# Patient Record
Sex: Female | Born: 1965 | Race: Black or African American | Hispanic: No | Marital: Married | Smoking: Light tobacco smoker
Health system: Southern US, Community
[De-identification: ages and names within clinical notes are randomized; demographics above are authoritative.]

## PROBLEM LIST (undated history)

## (undated) DIAGNOSIS — G709 Myoneural disorder, unspecified: Secondary | ICD-10-CM

## (undated) DIAGNOSIS — R0600 Dyspnea, unspecified: Secondary | ICD-10-CM

## (undated) DIAGNOSIS — F419 Anxiety disorder, unspecified: Secondary | ICD-10-CM

## (undated) DIAGNOSIS — R519 Headache, unspecified: Secondary | ICD-10-CM

## (undated) DIAGNOSIS — Z5189 Encounter for other specified aftercare: Secondary | ICD-10-CM

## (undated) DIAGNOSIS — G4730 Sleep apnea, unspecified: Secondary | ICD-10-CM

## (undated) DIAGNOSIS — N289 Disorder of kidney and ureter, unspecified: Secondary | ICD-10-CM

## (undated) HISTORY — PX: KIDNEY TRANSPLANT: SHX239

## (undated) HISTORY — DX: Encounter for other specified aftercare: Z51.89

## (undated) HISTORY — PX: NEPHRECTOMY TRANSPLANTED ORGAN: SUR880

## (undated) HISTORY — DX: Anxiety disorder, unspecified: F41.9

## (undated) HISTORY — PX: DILATION AND CURETTAGE OF UTERUS: SHX78

## (undated) HISTORY — DX: Headache, unspecified: R51.9

## (undated) HISTORY — DX: Myoneural disorder, unspecified: G70.9

## (undated) HISTORY — DX: Dyspnea, unspecified: R06.00

## (undated) HISTORY — DX: Sleep apnea, unspecified: G47.30

---

## 2011-03-23 DIAGNOSIS — Z940 Kidney transplant status: Secondary | ICD-10-CM

## 2011-03-23 HISTORY — DX: Kidney transplant status: Z94.0

## 2011-03-23 HISTORY — PX: KIDNEY TRANSPLANT: SHX239

## 2018-11-05 ENCOUNTER — Encounter (HOSPITAL_COMMUNITY): Payer: Self-pay

## 2018-11-05 ENCOUNTER — Other Ambulatory Visit: Payer: Self-pay

## 2018-11-05 ENCOUNTER — Ambulatory Visit (HOSPITAL_COMMUNITY)
Admission: EM | Admit: 2018-11-05 | Discharge: 2018-11-05 | Disposition: A | Discharge status: Home / Self Care | Payer: Managed Care, Other (non HMO) | Attending: Family Medicine | Admitting: Family Medicine

## 2018-11-05 DIAGNOSIS — R1011 Right upper quadrant pain: Secondary | ICD-10-CM | POA: Diagnosis not present

## 2018-11-05 HISTORY — DX: Disorder of kidney and ureter, unspecified: N28.9

## 2018-11-05 LAB — COMPREHENSIVE METABOLIC PANEL
ALT: 19 U/L (ref 0–44)
AST: 15 U/L (ref 15–41)
Albumin: 3.7 g/dL (ref 3.5–5.0)
Alkaline Phosphatase: 55 U/L (ref 38–126)
Anion gap: 12 (ref 5–15)
BUN: 5 mg/dL — ABNORMAL LOW (ref 6–20)
CO2: 20 mmol/L — ABNORMAL LOW (ref 22–32)
Calcium: 9.6 mg/dL (ref 8.9–10.3)
Chloride: 105 mmol/L (ref 98–111)
Creatinine, Ser: 0.62 mg/dL (ref 0.44–1.00)
GFR calc Af Amer: >60 mL/min (ref >60)
GFR calc non Af Amer: >60 mL/min (ref >60)
Glucose, Bld: 112 mg/dL — ABNORMAL HIGH (ref 70–99)
Potassium: 3.7 mmol/L (ref 3.5–5.1)
Sodium: 137 mmol/L (ref 135–145)
Total Bilirubin: 0.6 mg/dL (ref 0.3–1.2)
Total Protein: 7.1 g/dL (ref 6.5–8.1)

## 2018-11-05 LAB — CBC
HCT: 39.5 % (ref 36.0–46.0)
Hemoglobin: 13.3 g/dL (ref 12.0–15.0)
MCH: 33.6 pg (ref 26.0–34.0)
MCHC: 33.7 g/dL (ref 30.0–36.0)
MCV: 99.7 fL (ref 80.0–100.0)
Platelets: 275 10*3/uL (ref 150–400)
RBC: 3.96 MIL/uL (ref 3.87–5.11)
RDW: 12.1 % (ref 11.5–15.5)
WBC: 10 10*3/uL (ref 4.0–10.5)
nRBC: 0 % (ref 0.0–0.2)

## 2018-11-05 LAB — AMYLASE: Amylase: 60 U/L (ref 28–100)

## 2018-11-05 LAB — LIPASE, BLOOD: Lipase: 30 U/L (ref 11–51)

## 2018-11-05 NOTE — Discharge Instructions (Addendum)
Recommend using heating pad/hot towels for pain relief: Would avoid Tylenol, ibuprofen until your lab results are back. Recommend increasing water intake and eating foods that are soft to liquid. Liver, kidney, pancreatic function test pending: I will call you later today with the results and let you know the next if should be. Go to ER for further evaluation should you have worsening pain, shortness of breath, fever.

## 2018-11-05 NOTE — ED Provider Notes (Signed)
[facility]    CSN: [id number] Arrival date & time: 11/05/18  1250     History   Chief Complaint Chief Complaint  Patient presents with  . Side Pain    HPI Melanie [name] is a 53 y.o. female with history of cholecystectomy and renal transplant in April 2013 (placed in RLQ) on immunosuppressive therapy presenting for 2-day course of right upper quadrant pain.  Describes pain as near constant, worse with certain movements, and states it is "like a tightening sensation ".  Patient has tried Tylenol and Gas-X which has provided some relief in pain.  Patient has history of chronic nausea/vomiting due to immunosuppressive medications: Denies change from her baseline, biliary or bloody emesis.  Patient denies urinary symptoms, change in her bowel habit (1 bowel movement daily without blood or melena).  She denies history of pancreatitis, hepatitis.  Patient endorsing routine alcohol intake: Approximately 3-4 alcoholic beverages through Friday with occasional drinking on the weekend, "depending what I do ".  Patient still having her menstrual cycle/is not postmenopausal: LMP 8/9, still actively bleeding, though denies change from her baseline of prolonged periods, pelvic pain, vaginal discharge.  Patient does have history of anemia, unsure if it is iron deficient or second to blood loss: Patient denies fatigue, weakness, lightheadedness, chest pain, palpitations.  Patiently routinely followed by her nephrologist, has an appointment next month.  Patient denies fall, trauma to the area.  Patient works in an office job, though does "a little bit everything "including occasional heavy lifting cannot remember a specific event prior to symptom onset.  Past Medical History:  Diagnosis Date  . Renal disorder     There are no active problems to display for this patient.   Past Surgical History:  Procedure Laterality Date  . NEPHRECTOMY TRANSPLANTED ORGAN      OB History   No obstetric history  on file.      Home Medications    Prior to Admission medications   Medication Sig Start Date End Date Taking? Authorizing Provider  calcium-vitamin D (OSCAL WITH D) 500-200 MG-UNIT tablet Take 1 tablet by mouth daily with breakfast.   Yes [provider]  famotidine (PEPCID) 20 MG tablet Take 20 mg by mouth daily.   Yes [provider]  ferrous sulfate 325 (65 FE) MG tablet Take 325 mg by mouth daily with breakfast.   Yes [provider]  metoprolol tartrate (LOPRESSOR) 100 MG tablet Take 100 mg by mouth 2 (two) times daily.   Yes [provider]  mycophenolate (MYFORTIC) 360 MG TBEC EC tablet Take 360 mg by mouth 2 (two) times daily.   Yes [provider]  NIFEdipine (ADALAT CC) 60 MG 24 hr tablet Take 60 mg by mouth 2 (two) times daily.   Yes [provider]  potassium chloride SA (K-DUR) 20 MEQ tablet Take 20 mEq by mouth 2 (two) times daily.   Yes [provider]  predniSONE (DELTASONE) 5 MG tablet Take 5 mg by mouth daily with breakfast.   Yes [provider]  simvastatin (ZOCOR) 20 MG tablet Take 20 mg by mouth daily.   Yes [provider]  tacrolimus (PROGRAF) 1 MG capsule Take 7 mg by mouth 2 (two) times daily. 3 mg in am and 4 mg in pm   Yes [provider]    Family History Family History  Family history unknown: Yes    Social History Social History   Tobacco Use  . Smoking status: Light  Tobacco Smoker  . Smokeless tobacco: Never Used  Substance Use Topics  . Alcohol use: Not on file  . Drug use: Not on file     Allergies   Patient has no known allergies.   Review of Systems Review of Systems  Constitutional: Negative for fatigue and fever.  HENT: Negative for ear pain, sinus pain, sore throat and voice change.   Eyes: Negative for pain, redness and visual disturbance.  Respiratory: Negative for cough, shortness of breath and wheezing.   Cardiovascular: Negative for  chest pain and palpitations.  Gastrointestinal: Positive for abdominal distention and abdominal pain. Negative for anal bleeding, blood in stool, constipation, diarrhea, nausea, rectal pain and vomiting.  Genitourinary: Negative for dysuria, enuresis, flank pain, frequency, pelvic pain, urgency, vaginal discharge and vaginal pain.  Musculoskeletal: Negative for arthralgias, back pain, gait problem, myalgias, neck pain and neck stiffness.  Skin: Negative for color change, rash and wound.  Neurological: Negative for dizziness, tremors, syncope, weakness, light-headedness, numbness and headaches.  Hematological: Does not bruise/bleed easily.  Psychiatric/Behavioral: Negative for confusion. The patient is not nervous/anxious.      Physical Exam Triage Vital Signs ED Triage Vitals  Enc Vitals Group     BP 11/05/18 1414 (!) 149/90     Pulse Rate 11/05/18 1414 79     Resp 11/05/18 1414 17     Temp 11/05/18 1414 98.6 F (37 C)     Temp Source 11/05/18 1414 Oral     SpO2 11/05/18 1414 100 %     Weight --      Height --      Head Circumference --      Peak Flow --      Pain Score 11/05/18 1416 7     Pain Loc --      Pain Edu? --      Excl. in GC? --    No data found.  Updated Vital Signs BP (!) 149/90 (BP Location: Right Arm)   Pulse 79   Temp 98.6 F (37 C) (Oral)   Resp 17   SpO2 100%   Visual Acuity Right Eye Distance:   Left Eye Distance:   Bilateral Distance:    Right Eye Near:   Left Eye Near:    Bilateral Near:     Physical Exam Vitals signs reviewed.  Constitutional:      General: She is not in acute distress.    Appearance: She is normal weight. She is not ill-appearing.  HENT:     Head: Normocephalic and atraumatic.  Eyes:     General: No scleral icterus.       Right eye: No discharge.        Left eye: No discharge.     Extraocular Movements: Extraocular movements intact.     Pupils: Pupils are equal, round, and reactive to light.  Neck:      Musculoskeletal: Normal range of motion and neck supple. No muscular tenderness.  Cardiovascular:     Rate and Rhythm: Normal rate and regular rhythm.     Pulses: Normal pulses.     Heart sounds: No murmur.  Pulmonary:     Effort: Pulmonary effort is normal. No respiratory distress.     Breath sounds: No wheezing.  Chest:     Chest wall: No tenderness.  Abdominal:     General: Abdomen is flat. Bowel sounds are normal. There is distension.     Palpations: Abdomen is soft.     Tenderness: There  is no right CVA tenderness, left CVA tenderness, guarding or rebound.     Hernia: No hernia is present.     Comments: Positive for for right upper quadrant tenderness, Murphy sign.  Negative Rovsing's, rebound, liver friction rub  Musculoskeletal: Normal range of motion.     Right lower leg: No edema.     Left lower leg: No edema.  Lymphadenopathy:     Cervical: No cervical adenopathy.  Skin:    General: Skin is warm.     Capillary Refill: Capillary refill takes less than 2 seconds.     Coloration: Skin is not jaundiced or pale.     Findings: No rash.  Neurological:     General: No focal deficit present.     Mental Status: She is alert and oriented to person, place, and time.  Psychiatric:        Mood and Affect: Mood normal.        Thought Content: Thought content normal.      UC Treatments / Results  Labs (all labs ordered are listed, but only abnormal results are displayed) Labs Reviewed  CBC  COMPREHENSIVE METABOLIC PANEL  AMYLASE  LIPASE, BLOOD    EKG   Radiology No results found.  Procedures Procedures (including critical care time)  Medications Ordered in UC Medications - No data to display  Initial Impression / Assessment and Plan / UC Course  I have reviewed the triage vital signs and the nursing notes.  Pertinent labs & imaging results that were available during my care of the patient were reviewed by me and considered in my medical decision making (see  chart for details).     1.  Right upper quadrant pain Patient hemodynamically stable, no obvious abdominal fluid wave, scleral icterus noted.  Positive Murphy sign, though status post cholecystectomy.  Etiologies including gas, colitis, hepatitis, pancreatitis, gastritis.  Case reviewed with Dr. Delton SeeNelson who agreed with assessment/plan: CBC, CMP, amylase, lipase pending.  Reviewed with patient that her lab values come back significantly abnormal the recommendation would be to go to ER for further evaluation given her medical history.  Return precautions discussed, patient verbalized understanding and is agreeable to plan. Final Clinical Impressions(s) / UC Diagnoses   Final diagnoses:  Abdominal pain, RUQ (right upper quadrant)     Discharge Instructions     Recommend using heating pad/hot towels for pain relief: Would avoid Tylenol, ibuprofen until your lab results are back. Recommend increasing water intake and eating foods that are soft to liquid. Liver, kidney, pancreatic function test pending: I will call you later today with the results and let you know the next if should be. Go to ER for further evaluation should you have worsening pain, shortness of breath, fever.    ED Prescriptions    None     Controlled Substance Prescriptions [place] Controlled Substance Registry consulted? Not Applicable   Shea EvansHall-Potvin, Brittany, [place]-C 11/05/18 [id number]

## 2018-11-05 NOTE — ED Triage Notes (Signed)
Pt presents with right side pain from unknown source X 2 days.

## 2019-05-07 ENCOUNTER — Other Ambulatory Visit: Payer: Self-pay

## 2019-05-07 ENCOUNTER — Emergency Department (HOSPITAL_COMMUNITY)
Admission: EM | Admit: 2019-05-07 | Discharge: 2019-05-07 | Disposition: A | Discharge status: Home / Self Care | Payer: Managed Care, Other (non HMO) | Attending: Emergency Medicine | Admitting: Emergency Medicine

## 2019-05-07 ENCOUNTER — Emergency Department (HOSPITAL_COMMUNITY): Payer: Managed Care, Other (non HMO)

## 2019-05-07 DIAGNOSIS — Z79899 Other long term (current) drug therapy: Secondary | ICD-10-CM | POA: Insufficient documentation

## 2019-05-07 DIAGNOSIS — N920 Excessive and frequent menstruation with regular cycle: Secondary | ICD-10-CM | POA: Insufficient documentation

## 2019-05-07 DIAGNOSIS — F17210 Nicotine dependence, cigarettes, uncomplicated: Secondary | ICD-10-CM | POA: Insufficient documentation

## 2019-05-07 DIAGNOSIS — N939 Abnormal uterine and vaginal bleeding, unspecified: Secondary | ICD-10-CM | POA: Diagnosis not present

## 2019-05-07 DIAGNOSIS — R52 Pain, unspecified: Secondary | ICD-10-CM

## 2019-05-07 LAB — CBC WITH DIFFERENTIAL/PLATELET
Abs Immature Granulocytes: 0.11 10*3/uL — ABNORMAL HIGH (ref 0.00–0.07)
Basophils Absolute: 0 10*3/uL (ref 0.0–0.1)
Basophils Relative: 0 %
Eosinophils Absolute: 0 10*3/uL (ref 0.0–0.5)
Eosinophils Relative: 0 %
HCT: 33.3 % — ABNORMAL LOW (ref 36.0–46.0)
Hemoglobin: 10.8 g/dL — ABNORMAL LOW (ref 12.0–15.0)
Immature Granulocytes: 1 %
Lymphocytes Relative: 13 %
Lymphs Abs: 1.1 10*3/uL (ref 0.7–4.0)
MCH: 35.3 pg — ABNORMAL HIGH (ref 26.0–34.0)
MCHC: 32.4 g/dL (ref 30.0–36.0)
MCV: 108.8 fL — ABNORMAL HIGH (ref 80.0–100.0)
Monocytes Absolute: 0.5 10*3/uL (ref 0.1–1.0)
Monocytes Relative: 6 %
Neutro Abs: 7.1 10*3/uL (ref 1.7–7.7)
Neutrophils Relative %: 80 %
Platelets: 259 10*3/uL (ref 150–400)
RBC: 3.06 MIL/uL — ABNORMAL LOW (ref 3.87–5.11)
RDW: 14.5 % (ref 11.5–15.5)
WBC: 8.9 10*3/uL (ref 4.0–10.5)
nRBC: 0 % (ref 0.0–0.2)

## 2019-05-07 LAB — I-STAT BETA HCG BLOOD, ED (MC, WL, AP ONLY): I-stat hCG, quantitative: <5 m[IU]/mL (ref <5)

## 2019-05-07 LAB — COMPREHENSIVE METABOLIC PANEL
ALT: 26 U/L (ref 0–44)
AST: 28 U/L (ref 15–41)
Albumin: 3.7 g/dL (ref 3.5–5.0)
Alkaline Phosphatase: 41 U/L (ref 38–126)
Anion gap: 13 (ref 5–15)
BUN: 10 mg/dL (ref 6–20)
CO2: 19 mmol/L — ABNORMAL LOW (ref 22–32)
Calcium: 9.1 mg/dL (ref 8.9–10.3)
Chloride: 105 mmol/L (ref 98–111)
Creatinine, Ser: 0.75 mg/dL (ref 0.44–1.00)
GFR calc Af Amer: >60 mL/min (ref >60)
GFR calc non Af Amer: >60 mL/min (ref >60)
Glucose, Bld: 125 mg/dL — ABNORMAL HIGH (ref 70–99)
Potassium: 3.3 mmol/L — ABNORMAL LOW (ref 3.5–5.1)
Sodium: 137 mmol/L (ref 135–145)
Total Bilirubin: 0.3 mg/dL (ref 0.3–1.2)
Total Protein: 6.3 g/dL — ABNORMAL LOW (ref 6.5–8.1)

## 2019-05-07 LAB — WET PREP, GENITAL
Clue Cells Wet Prep HPF POC: NONE SEEN
Sperm: NONE SEEN
Trich, Wet Prep: NONE SEEN
Yeast Wet Prep HPF POC: NONE SEEN

## 2019-05-07 NOTE — ED Triage Notes (Signed)
pt here today with a c/c of irregular vaginal bleeding that has been ongoing since 03/23/19. Pt reports at times bleeding has been heavy and currently is soaking through tampon in 2 hours and she came to ER today due to passing clots and feeling lightheaded after standing. Pt reports over the last couple years she has these episodes of bleeding that might last 5-6 weeks.

## 2019-05-07 NOTE — Discharge Instructions (Signed)
Gynecology will call you to schedule a follow up appointment.  You need to have an endometrial biopsy.

## 2019-05-07 NOTE — ED Provider Notes (Signed)
[facility] Provider Note   CSN: [id number] Arrival date & time: 05/07/19  1021     History Chief Complaint  Patient presents with  . Vaginal Bleeding    Melanie [name] is a 54 y.o. female.  The history is provided by the patient. No language interpreter was used.  Vaginal Bleeding Quality:  Bright red Severity:  Moderate Onset quality:  Gradual Duration:  6 weeks Timing:  Constant Progression:  Worsening Chronicity:  Recurrent Menstrual history:  Irregular Possible pregnancy: no   Context: not after intercourse and not spontaneously   Relieved by:  Nothing Worsened by:  Nothing Ineffective treatments:  None tried Associated symptoms: no abdominal pain and no fever   Risk factors: no bleeding disorder, no new sexual partner and no STD        Past Medical History:  Diagnosis Date  . Renal disorder     There are no problems to display for this patient.   Past Surgical History:  Procedure Laterality Date  . NEPHRECTOMY TRANSPLANTED ORGAN       OB History   No obstetric history on file.     Family History  Family history unknown: Yes    Social History   Tobacco Use  . Smoking status: Light Tobacco Smoker  . Smokeless tobacco: Never Used  Substance Use Topics  . Alcohol use: Not on file  . Drug use: Not on file    Home Medications Prior to Admission medications   Medication Sig Start Date End Date Taking? Authorizing Provider  calcium-vitamin D (OSCAL WITH D) 500-200 MG-UNIT tablet Take 1 tablet by mouth daily with breakfast.    [provider]  famotidine (PEPCID) 20 MG tablet Take 20 mg by mouth daily.    [provider]  ferrous sulfate 325 (65 FE) MG tablet Take 325 mg by mouth daily with breakfast.    [provider]  metoprolol tartrate (LOPRESSOR) 100 MG tablet Take 100 mg by mouth 2 (two) times daily.    [provider]  mycophenolate (MYFORTIC) 360 MG TBEC EC tablet  Take 360 mg by mouth 2 (two) times daily.    [provider]  NIFEdipine (ADALAT CC) 60 MG 24 hr tablet Take 60 mg by mouth 2 (two) times daily.    [provider]  potassium chloride SA (K-DUR) 20 MEQ tablet Take 20 mEq by mouth 2 (two) times daily.    [provider]  predniSONE (DELTASONE) 5 MG tablet Take 5 mg by mouth daily with breakfast.    [provider]  simvastatin (ZOCOR) 20 MG tablet Take 20 mg by mouth daily.    [provider]  tacrolimus (PROGRAF) 1 MG capsule Take 7 mg by mouth 2 (two) times daily. 3 mg in am and 4 mg in pm    [provider]    Allergies    Patient has no known allergies.  Review of Systems   Review of Systems  Constitutional: Negative for fever.  Gastrointestinal: Negative for abdominal pain.  Genitourinary: Positive for vaginal bleeding.  All other systems reviewed and are negative.   Physical Exam Updated Vital Signs BP 135/86 (BP Location: Right Arm)   Pulse 72   Resp 19   SpO2 99%   Physical Exam Vitals and nursing note reviewed.  Constitutional:      Appearance: She is well-developed.  HENT:     Head: Normocephalic.     Nose: Nose normal.  Mouth/Throat:     Mouth: Mucous membranes are moist.  Eyes:     Pupils: Pupils are equal, round, and reactive to light.  Cardiovascular:     Rate and Rhythm: Normal rate and regular rhythm.  Pulmonary:     Effort: Pulmonary effort is normal.  Abdominal:     General: Abdomen is flat. There is no distension.  Genitourinary:    Vagina: No vaginal discharge.     Comments: Moderate vaginal bleeding  Musculoskeletal:        General: Normal range of motion.     Cervical back: Normal range of motion.  Skin:    General: Skin is warm.  Neurological:     Mental Status: She is alert and oriented to person, place, and time.  Psychiatric:        Mood and Affect: Mood normal.     ED Results / Procedures / Treatments   Labs (all labs ordered  are listed, but only abnormal results are displayed) Labs Reviewed  WET PREP, GENITAL - Abnormal; Notable for the following components:      Result Value   WBC, Wet Prep HPF POC FEW (*)    All other components within normal limits  CBC WITH DIFFERENTIAL/PLATELET - Abnormal; Notable for the following components:   RBC 3.06 (*)    Hemoglobin 10.8 (*)    HCT 33.3 (*)    MCV 108.8 (*)    MCH 35.3 (*)    Abs Immature Granulocytes 0.11 (*)    All other components within normal limits  COMPREHENSIVE METABOLIC PANEL - Abnormal; Notable for the following components:   Potassium 3.3 (*)    CO2 19 (*)    Glucose, Bld 125 (*)    Total Protein 6.3 (*)    All other components within normal limits  I-STAT BETA HCG BLOOD, ED ([facility], [facility], [facility] ONLY)  GC/CHLAMYDIA PROBE AMP ([place]) NOT AT [facility]    EKG None  Radiology US PELVIC COMPLETE W TRANSVAGINAL AND TORSION R/O  Result Date: 05/07/2019 CLINICAL DATA:  Menorrhagia since 03/23/2019, pain EXAM: TRANSABDOMINAL AND TRANSVAGINAL ULTRASOUND OF PELVIS DOPPLER ULTRASOUND OF OVARIES TECHNIQUE: Both transabdominal and transvaginal ultrasound examinations of the pelvis were performed. Transabdominal technique was performed for global imaging of the pelvis including uterus, ovaries, adnexal regions, and pelvic cul-de-sac. It was necessary to proceed with endovaginal exam following the transabdominal exam to visualize the adnexal structures. Color and duplex Doppler ultrasound was utilized to evaluate blood flow to the ovaries. COMPARISON:  None. FINDINGS: Uterus Measurements: 13.6 x 7.7 x 8.2 cm = volume: 450 mL. No fibroids or other mass visualized. Endometrium Thickness: Approximately 28 mm. Endometrium is heterogeneous and thickened. Vascularity is mildly increased. There may be an endometrial polyp versus more localized adenomyosis in the lower uterine segment measuring 1.8 x 1.2 x 1.6 cm. Pelvic MRI is recommended for further evaluation. Right ovary  Measurements: 3.8 x 2.5 x 2.4 cm = volume: 12.2 mL. Simple appearing cyst measuring up to 2.9 cm is identified. Left ovary Measurements: 2.6 x 3.0 x 1.8 cm = volume: 7.3 mL. Normal follicles identified. No masses. Pulsed Doppler evaluation of both ovaries demonstrates normal low-resistance arterial and venous waveforms. Other findings No abnormal free fluid. IMPRESSION: 1. Heterogeneous thickened endometrium as above. In this perimenopausal patient, differential would include adenomyosis, hyperplasia, or neoplasia. I cannot exclude a small endometrial polyp within the lower uterine segment. Pelvic MRI or hysteroscopy/endometrial sampling is recommended for further evaluation on a nonemergent basis. 2. Otherwise  age-appropriate pelvic ultrasound. Electronically Signed   By: Sharlet Salina M.D.   On: 05/07/2019 15:35    Procedures Procedures (including critical care time)  Medications Ordered in ED Medications - No data to display  ED Course  I have reviewed the triage vital signs and the nursing notes.  Pertinent labs & imaging results that were available during my care of the patient were reviewed by me and considered in my medical decision making (see chart for details).  Clinical Course as of May 06 1553  Mon May 07, 2019  1546 US PELVIC COMPLETE W TRANSVAGINAL AND TORSION R/O [LS]    Clinical Course User Index [LS] Osie Cheeks   MDM Rules/Calculators/A&P                      MDM: Pt does not have a gynecologist. I sent a message to the gynecology clinic to followup  Final Clinical Impression(s) / ED Diagnoses Final diagnoses:  Pain  Vaginal bleeding    Rx / DC Orders ED Discharge Orders    None    An After Visit Summary was printed and given to the patient.    Osie Cheeks 05/07/19 1605    Tilden Fossa, MD 05/08/19 Flossie Buffy

## 2019-05-08 LAB — GC/CHLAMYDIA PROBE AMP (~~LOC~~) NOT AT ARMC
Chlamydia: NEGATIVE
Neisseria Gonorrhea: NEGATIVE

## 2019-05-30 ENCOUNTER — Ambulatory Visit: Payer: Managed Care, Other (non HMO) | Admitting: Obstetrics and Gynecology

## 2019-06-29 ENCOUNTER — Ambulatory Visit: Payer: Managed Care, Other (non HMO) | Admitting: Obstetrics and Gynecology

## 2019-06-29 ENCOUNTER — Other Ambulatory Visit (HOSPITAL_COMMUNITY)
Admission: RE | Admit: 2019-06-29 | Discharge: 2019-06-29 | Disposition: A | Discharge status: Home / Self Care | Payer: Managed Care, Other (non HMO) | Source: Ambulatory Visit | Attending: Obstetrics and Gynecology | Admitting: Obstetrics and Gynecology

## 2019-06-29 ENCOUNTER — Encounter: Payer: Self-pay | Admitting: Obstetrics and Gynecology

## 2019-06-29 ENCOUNTER — Other Ambulatory Visit: Payer: Self-pay

## 2019-06-29 DIAGNOSIS — N921 Excessive and frequent menstruation with irregular cycle: Secondary | ICD-10-CM | POA: Insufficient documentation

## 2019-06-29 DIAGNOSIS — N920 Excessive and frequent menstruation with regular cycle: Secondary | ICD-10-CM | POA: Insufficient documentation

## 2019-06-29 DIAGNOSIS — Z1231 Encounter for screening mammogram for malignant neoplasm of breast: Secondary | ICD-10-CM | POA: Diagnosis not present

## 2019-06-29 DIAGNOSIS — Z01419 Encounter for gynecological examination (general) (routine) without abnormal findings: Secondary | ICD-10-CM | POA: Diagnosis present

## 2019-06-29 DIAGNOSIS — Z01411 Encounter for gynecological examination (general) (routine) with abnormal findings: Secondary | ICD-10-CM | POA: Insufficient documentation

## 2019-06-29 MED ORDER — MEGESTROL ACETATE 40 MG PO TABS: 20.0000 mg | ORAL_TABLET | Freq: Every day | ORAL | 5 refills | Status: DC

## 2019-06-29 NOTE — Patient Instructions (Addendum)
Health Maintenance, Female Adopting a healthy lifestyle and getting preventive care are important in promoting health and wellness. Ask your health care provider about:  The right schedule for you to have regular tests and exams.  Things you can do on your own to prevent diseases and keep yourself healthy. What should I know about diet, weight, and exercise? Eat a healthy diet   Eat a diet that includes plenty of vegetables, fruits, low-fat dairy products, and lean protein.  Do not eat a lot of foods that are high in solid fats, added sugars, or sodium. Maintain a healthy weight Body mass index (BMI) is used to identify weight problems. It estimates body fat based on height and weight. Your health care provider can help determine your BMI and help you achieve or maintain a healthy weight. Get regular exercise Get regular exercise. This is one of the most important things you can do for your health. Most adults should:  Exercise for at least 150 minutes each week. The exercise should increase your heart rate and make you sweat (moderate-intensity exercise).  Do strengthening exercises at least twice a week. This is in addition to the moderate-intensity exercise.  Spend less time sitting. Even light physical activity can be beneficial. Watch cholesterol and blood lipids Have your blood tested for lipids and cholesterol at 53 years of age, then have this test every 5 years. Have your cholesterol levels checked more often if:  Your lipid or cholesterol levels are high.  You are older than 53 years of age.  You are at high risk for heart disease. What should I know about cancer screening? Depending on your health history and family history, you may need to have cancer screening at various ages. This may include screening for:  Breast cancer.  Cervical cancer.  Colorectal cancer.  Skin cancer.  Lung cancer. What should I know about heart disease, diabetes, and high blood  pressure? Blood pressure and heart disease  High blood pressure causes heart disease and increases the risk of stroke. This is more likely to develop in people who have high blood pressure readings, are of African descent, or are overweight.  Have your blood pressure checked: ? Every 3-5 years if you are 18-39 years of age. ? Every year if you are 40 years old or older. Diabetes Have regular diabetes screenings. This checks your fasting blood sugar level. Have the screening done:  Once every three years after age 40 if you are at a normal weight and have a low risk for diabetes.  More often and at a younger age if you are overweight or have a high risk for diabetes. What should I know about preventing infection? Hepatitis B If you have a higher risk for hepatitis B, you should be screened for this virus. Talk with your health care provider to find out if you are at risk for hepatitis B infection. Hepatitis C Testing is recommended for:  Everyone born from 1945 through 1965.  Anyone with known risk factors for hepatitis C. Sexually transmitted infections (STIs)  Get screened for STIs, including gonorrhea and chlamydia, if: ? You are sexually active and are younger than 53 years of age. ? You are older than 53 years of age and your health care provider tells you that you are at risk for this type of infection. ? Your sexual activity has changed since you were last screened, and you are at increased risk for chlamydia or gonorrhea. Ask your health care provider if   you are at risk.  Ask your health care provider about whether you are at high risk for HIV. Your health care provider may recommend a prescription medicine to help prevent HIV infection. If you choose to take medicine to prevent HIV, you should first get tested for HIV. You should then be tested every 3 months for as long as you are taking the medicine. Pregnancy  If you are about to stop having your period (premenopausal) and  you may become pregnant, seek counseling before you get pregnant.  Take 400 to 800 micrograms (mcg) of folic acid every day if you become pregnant.  Ask for birth control (contraception) if you want to prevent pregnancy. Osteoporosis and menopause Osteoporosis is a disease in which the bones lose minerals and strength with aging. This can result in bone fractures. If you are 66 years old or older, or if you are at risk for osteoporosis and fractures, ask your health care provider if you should:  Be screened for bone loss.  Take a calcium or vitamin D supplement to lower your risk of fractures.  Be given hormone replacement therapy (HRT) to treat symptoms of menopause. Follow these instructions at home: Lifestyle  Do not use any products that contain nicotine or tobacco, such as cigarettes, e-cigarettes, and chewing tobacco. If you need help quitting, ask your health care provider.  Do not use street drugs.  Do not share needles.  Ask your health care provider for help if you need support or information about quitting drugs. Alcohol use  Do not drink alcohol if: ? Your health care provider tells you not to drink. ? You are pregnant, may be pregnant, or are planning to become pregnant.  If you drink alcohol: ? Limit how much you use to 0-1 drink a day. ? Limit intake if you are breastfeeding.  Be aware of how much alcohol is in your drink. In the [place] one drink equals one 12 oz bottle of beer (355 mL), one 5 oz glass of wine (148 mL), or one 1 oz glass of hard liquor (44 mL). General instructions  Schedule regular health, dental, and eye exams.  Stay current with your vaccines.  Tell your health care provider if: ? You often feel depressed. ? You have ever been abused or do not feel safe at home. Summary  Adopting a healthy lifestyle and getting preventive care are important in promoting health and wellness.  Follow your health care provider's instructions about healthy  diet, exercising, and getting tested or screened for diseases.  Follow your health care provider's instructions on monitoring your cholesterol and blood pressure. This information is not intended to replace advice given to you by your health care provider. Make sure you discuss any questions you have with your health care provider. Document Revised: 03/01/2018 Document Reviewed: 03/01/2018 Elsevier Patient Education  2020 Elsevier Inc.  Endometrial Biopsy, Care After This sheet gives you information about how to care for yourself after your procedure. Your health care provider may also give you more specific instructions. If you have problems or questions, contact your health care provider. What can I expect after the procedure? After the procedure, it is common to have:  Mild cramping.  A small amount of vaginal bleeding for a few days. This is normal. Follow these instructions at home:   Take over-the-counter and prescription medicines only as told by your health care provider.  Do not douche, use tampons, or have sexual intercourse until your health care provider approves.  Return to your normal activities as told by your health care provider. Ask your health care provider what activities are safe for you.  Follow instructions from your health care provider about any activity restrictions, such as restrictions on strenuous exercise or heavy lifting. Contact a health care provider if:  You have heavy bleeding, or bleed for longer than 2 days after the procedure.  You have bad smelling discharge from your vagina.  You have a fever or chills.  You have a burning sensation when urinating or you have difficulty urinating.  You have severe pain in your lower abdomen. Get help right away if:  You have severe cramps in your stomach or back.  You pass large blood clots.  Your bleeding increases.  You become weak or light-headed, or you pass out. Summary  After the procedure,  it is common to have mild cramping and a small amount of vaginal bleeding for a few days.  Do not douche, use tampons, or have sexual intercourse until your health care provider approves.  Return to your normal activities as told by your health care provider. Ask your health care provider what activities are safe for you. This information is not intended to replace advice given to you by your health care provider. Make sure you discuss any questions you have with your health care provider. Document Revised: 02/18/2017 Document Reviewed: 03/24/2016 Elsevier Patient Education  [place].

## 2019-06-29 NOTE — Progress Notes (Addendum)
NEW GYN/PAP.  Last PAP a few years ago. Had Kidney Transplant 2013.  PHQ-9=9  C/o bleeding x 2.5 months, changing pads and tampons every hours, cramps 3/10, blood clots size of a quarter

## 2019-06-29 NOTE — Progress Notes (Signed)
Patient ID: Melanie [name], female   DOB: December 23, 1965, 54 y.o.   MRN: [id number]  Melanie [name] is a 54 y.o. G79P1011 female here for a routine annual gynecologic exam.  Current complaints: cycle since fist of Jan. She stopped bleeding heavily about 1 week ago. Only spotting since. Reports heavy monthly cycles prior.  Was seen in [place] for bleeding in Feb, H & H [id number] and U/S thicken endometrium, o/w normal.     Same sex relationship.  H/O renal transplant and HTN, followed by Dr. Hyman Hopes   Gynecologic History LMP as per HPI Contraception: same sex relationship Last Pap: 2017. Results were: normal Last mammogram: 2017. Results were: normal  Obstetric History OB History  Gravida Para Term Preterm AB Living  2 1 1   1 1   SAB TAB Ectopic Multiple Live Births          1    # Outcome Date GA Lbr Len/2nd Weight Sex Delivery Anes PTL Lv  2 AB 04/06/87 [redacted]w[redacted]d    SAB   FD  1 Term 11/29/85   8 lb 13 oz (3.997 kg) F Vag-Spont  N LIV    Past Medical History:  Diagnosis Date  . Anxiety   . Blood transfusion without reported diagnosis   . Dyspnea   . Headache   . Neuromuscular disorder (HCC)   . Renal disorder   . Sleep apnea     Past Surgical History:  Procedure Laterality Date  . DILATION AND CURETTAGE OF UTERUS    . NEPHRECTOMY TRANSPLANTED ORGAN      Current Outpatient Medications on File Prior to Visit  Medication Sig Dispense Refill  . calcium-vitamin D (OSCAL WITH D) 500-200 MG-UNIT tablet Take 1 tablet by mouth daily with breakfast.    . famotidine (PEPCID) 20 MG tablet Take 20 mg by mouth daily.    . ferrous sulfate 325 (65 FE) MG tablet Take 325 mg by mouth daily with breakfast.    . metoprolol tartrate (LOPRESSOR) 100 MG tablet Take 100 mg by mouth 2 (two) times daily.    . mycophenolate (MYFORTIC) 360 MG TBEC EC tablet Take 360 mg by mouth 2 (two) times daily.    01/29/86 NIFEdipine (ADALAT CC) 60 MG 24 hr tablet Take 60 mg by mouth 2 (two) times daily.    . potassium chloride SA (K-DUR)  20 MEQ tablet Take 20 mEq by mouth 2 (two) times daily.    . predniSONE (DELTASONE) 5 MG tablet Take 5 mg by mouth daily with breakfast.    . simvastatin (ZOCOR) 20 MG tablet Take 20 mg by mouth daily.    . tacrolimus (PROGRAF) 1 MG capsule Take 7 mg by mouth 2 (two) times daily. 3 mg in am and 4 mg in pm     No current facility-administered medications on file prior to visit.    No Known Allergies  Social History   Socioeconomic History  . Marital status: Married    Spouse name: Not on file  . Number of children: Not on file  . Years of education: Not on file  . Highest education level: Not on file  Occupational History  . Not on file  Tobacco Use  . Smoking status: Light Tobacco Smoker  . Smokeless tobacco: Never Used  Substance and Sexual Activity  . Alcohol use: Not on file  . Drug use: Not on file  . Sexual activity: Not on file  Other Topics Concern  . Not on file  Social History  Narrative  . Not on file   Social Determinants of Health   Financial Resource Strain:   . Difficulty of Paying Living Expenses:   Food Insecurity:   . Worried About Programme researcher, broadcasting/film/video in the Last Year:   . Barista in the Last Year:   Transportation Needs:   . Freight forwarder (Medical):   Marland Kitchen Lack of Transportation (Non-Medical):   Physical Activity:   . Days of Exercise per Week:   . Minutes of Exercise per Session:   Stress:   . Feeling of Stress :   Social Connections:   . Frequency of Communication with Friends and Family:   . Frequency of Social Gatherings with Friends and Family:   . Attends Religious Services:   . Active Member of Clubs or Organizations:   . Attends Banker Meetings:   Marland Kitchen Marital Status:   Intimate Partner Violence:   . Fear of Current or Ex-Partner:   . Emotionally Abused:   Marland Kitchen Physically Abused:   . Sexually Abused:     Family History  Problem Relation Age of Onset  . Diabetes Father   . Hypertension Father   . Alcohol  abuse Maternal Grandmother   . Alcohol abuse Maternal Grandfather   . Alcohol abuse Paternal Grandmother   . Diabetes Paternal Grandmother   . Alcohol abuse Paternal Grandfather     The following portions of the patient's history were reviewed and updated as appropriate: allergies, current medications, past family history, past medical history, past social history, past surgical history and problem list.  Review of Systems Pertinent items noted in HPI and remainder of comprehensive ROS otherwise negative.   Objective:  BP (!) 147/89   Pulse 84   Temp (!) 97.4 F (36.3 C)   Wt 183 lb 6.4 oz (83.2 kg)  CONSTITUTIONAL: Well-developed, well-nourished female in no acute distress.  HENT:  Normocephalic, atraumatic, External right and left ear normal. Oropharynx is clear and moist EYES: Conjunctivae and EOM are normal. Pupils are equal, round, and reactive to light. No scleral icterus.  NECK: Normal range of motion, supple, no masses.  Normal thyroid.  SKIN: Skin is warm and dry. No rash noted. Not diaphoretic. No erythema. No pallor. NEUROLGIC: Alert and oriented to person, place, and time. Normal reflexes, muscle tone coordination. No cranial nerve deficit noted. PSYCHIATRIC: Normal mood and affect. Normal behavior. Normal judgment and thought content. CARDIOVASCULAR: Normal heart rate noted, regular rhythm RESPIRATORY: Clear to auscultation bilaterally. Effort and breath sounds normal, no problems with respiration noted. BREASTS: Symmetric in size. No masses, skin changes, nipple drainage, or lymphadenopathy. ABDOMEN: Soft, normal bowel sounds, no distention noted.  No tenderness, rebound or guarding.  PELVIC: Normal appearing external genitalia; normal appearing vaginal mucosa and cervix.  No abnormal discharge noted.  Pap smear obtained. Uterus 10-12 weeks in size, no other palpable masses, no uterine or adnexal tenderness. MUSCULOSKELETAL: Normal range of motion. No tenderness.  No  cyanosis, clubbing, or edema.  2+ distal pulses.  ENDOMETRIAL BIOPSY     The indications for endometrial biopsy were reviewed.   Risks of the biopsy including cramping, bleeding, infection, uterine perforation, inadequate specimen and need for additional procedures  were discussed. The patient states she understands and agrees to undergo procedure today. Consent was signed. Time out was performed. Urine HCG was negative. During the pelvic exam, the cervix was prepped with Betadine. A single-toothed tenaculum was placed on the anterior lip of the cervix to  stabilize it. The 3 mm pipelle was introduced into the endometrial cavity without difficulty to a depth of 10 cm, and a moderate amount of tissue was obtained and sent to pathology. The instruments were removed from the patient's vagina. Minimal bleeding from the cervix was noted. The patient tolerated the procedure well. Routine post-procedure instructions were given to the patient.     Assessment:  Annual gynecologic examination with pap smear Menorrhagia Screening mammogram  Plan:  Will follow up results of pap smear and manage accordingly. Mammogram scheduled EMBX completed today Dx reviewed with pt. Will start Megace 20 mg daily. Routine preventative health maintenance measures emphasized. Please refer to After Visit Summary for other counseling recommendations.  F/U per test results or in 3 months   Chancy Milroy, MD, FACOG Attending [place] for [facility], [place]

## 2019-07-02 ENCOUNTER — Other Ambulatory Visit: Payer: Self-pay

## 2019-07-02 LAB — SURGICAL PATHOLOGY

## 2019-07-02 MED ORDER — MEGESTROL ACETATE 40 MG PO TABS: 20.0000 mg | ORAL_TABLET | Freq: Every day | ORAL | 5 refills | Status: AC

## 2019-07-03 ENCOUNTER — Telehealth: Payer: Self-pay

## 2019-07-03 LAB — CYTOLOGY - PAP
Comment: NEGATIVE
Diagnosis: NEGATIVE
High risk HPV: NEGATIVE

## 2019-07-03 NOTE — Telephone Encounter (Signed)
-----   Message from Hermina Staggers, MD sent at 07/03/2019  9:32 AM EDT ----- Please let Ms Hout know that her pap smear was normal.  Thanks Casimiro Needle

## 2019-07-03 NOTE — Telephone Encounter (Signed)
Attempted to call for results,LVM  Advising to call office back for test results.

## 2019-07-03 NOTE — Telephone Encounter (Signed)
Sanah called back and left a message she is returning a call . I called Ailed and informed her Dr.Ervin wanted her to know her pap results came back as normal. She voices understanding. Linda,RN

## 2019-07-04 ENCOUNTER — Telehealth: Payer: Self-pay

## 2019-07-04 NOTE — Telephone Encounter (Signed)
Advised of results and provider recommendations, pt agreed.

## 2019-07-04 NOTE — Telephone Encounter (Signed)
Attempted to contact, no answer, left vm ?

## 2019-10-10 ENCOUNTER — Encounter (HOSPITAL_COMMUNITY): Payer: Self-pay

## 2019-10-10 ENCOUNTER — Other Ambulatory Visit: Payer: Self-pay

## 2019-10-10 ENCOUNTER — Ambulatory Visit (HOSPITAL_COMMUNITY)
Admission: EM | Admit: 2019-10-10 | Discharge: 2019-10-10 | Disposition: A | Discharge status: Home / Self Care | Payer: Managed Care, Other (non HMO) | Attending: Family Medicine | Admitting: Family Medicine

## 2019-10-10 DIAGNOSIS — N3001 Acute cystitis with hematuria: Secondary | ICD-10-CM | POA: Diagnosis present

## 2019-10-10 MED ORDER — CEPHALEXIN 500 MG PO CAPS: 500.0000 mg | ORAL_CAPSULE | Freq: Two times a day (BID) | ORAL | 0 refills | Status: DC

## 2019-10-10 NOTE — ED Triage Notes (Signed)
Pt presents to UC for lower abdominal pain, urinary frequency/ urgency. Pt also complaining of burning with urination. Pt denies flank pain, fever chills, n/v/d.

## 2019-10-10 NOTE — Discharge Instructions (Addendum)
Treating you for a urinary tract infection.  We will send for culture and sensitivity. Take the Keflex twice a day for 5 days. Drink plenty of water.  Follow up as needed for continued or worsening symptoms

## 2019-10-10 NOTE — ED Notes (Signed)
Urinalysis testing exceeded the Clinite[employer] capabilities. Notified provider. Urine culture ordered.

## 2019-10-11 NOTE — ED Provider Notes (Signed)
[facility]    CSN: [id number] Arrival date & time: 10/10/19  1055      History   Chief Complaint Chief Complaint  Patient presents with  . Urinary Frequency    HPI Melanie [name] is a 54 y.o. female.   Patient is a 54 year old female the presents today with lower abdominal pain, urinary frequency, urgency and dysuria.  Symptoms have been constant.  She has not done anything to treat his symptoms.  Denies any associated flank pain, fever, chills, nausea, vomiting or diarrhea.  No vaginal discharge or bleeding. No LMP recorded. Patient is postmenopausal.  ROS per HPI      Past Medical History:  Diagnosis Date  . Anxiety   . Blood transfusion without reported diagnosis   . Dyspnea   . Headache   . Neuromuscular disorder (HCC)   . Renal disorder   . Sleep apnea     Patient Active Problem List   Diagnosis Date Noted  . Visit for routine gyn exam 06/29/2019  . Screening mammogram, encounter for 06/29/2019  . Menorrhagia 06/29/2019    Past Surgical History:  Procedure Laterality Date  . DILATION AND CURETTAGE OF UTERUS    . KIDNEY TRANSPLANT    . NEPHRECTOMY TRANSPLANTED ORGAN      OB History    Gravida  2   Para  1   Term  1   Preterm      AB  1   Living  1     SAB      TAB      Ectopic      Multiple      Live Births  1            Home Medications    Prior to Admission medications   Medication Sig Start Date End Date Taking? Authorizing Provider  calcium-vitamin D (OSCAL WITH D) 500-200 MG-UNIT tablet Take 1 tablet by mouth daily with breakfast.   Yes [provider]  famotidine (PEPCID) 20 MG tablet Take 20 mg by mouth daily.   Yes [provider]  ferrous sulfate 325 (65 FE) MG tablet Take 325 mg by mouth daily with breakfast.   Yes [provider]  megestrol (MEGACE) 40 MG tablet Take 0.5 tablets (20 mg total) by mouth daily. Can increase to two tablets twice a day in the event of heavy bleeding  07/02/19  Yes Hermina Staggers, MD  metoprolol tartrate (LOPRESSOR) 100 MG tablet Take 100 mg by mouth 2 (two) times daily.   Yes [provider]  mycophenolate (MYFORTIC) 360 MG TBEC EC tablet Take 360 mg by mouth 2 (two) times daily.   Yes [provider]  NIFEdipine (ADALAT CC) 60 MG 24 hr tablet Take 60 mg by mouth 2 (two) times daily.   Yes [provider]  potassium chloride SA (K-DUR) 20 MEQ tablet Take 20 mEq by mouth 2 (two) times daily.   Yes [provider]  predniSONE (DELTASONE) 5 MG tablet Take 5 mg by mouth daily with breakfast.   Yes [provider]  simvastatin (ZOCOR) 20 MG tablet Take 20 mg by mouth daily.   Yes [provider]  tacrolimus (PROGRAF) 1 MG capsule Take 7 mg by mouth 2 (two) times daily. 3 mg in am and 4 mg in pm   Yes [provider]  cephALEXin (KEFLEX) 500 MG capsule Take 1 capsule (500 mg total) by mouth 2 (two) times daily. 10/10/19   Jaci Lazier,  Nakari Bracknell A, NP  NIFEdipine (PROCARDIA XL/NIFEDICAL XL) 60 MG 24 hr tablet Take 60 mg by mouth 2 (two) times daily. 09/11/19   [provider]    Family History Family History  Problem Relation Age of Onset  . Diabetes Father   . Hypertension Father   . Alcohol abuse Maternal Grandmother   . Alcohol abuse Maternal Grandfather   . Alcohol abuse Paternal Grandmother   . Diabetes Paternal Grandmother   . Alcohol abuse Paternal Grandfather     Social History Social History   Tobacco Use  . Smoking status: Light Tobacco Smoker  . Smokeless tobacco: Never Used  Substance Use Topics  . Alcohol use: Not on file  . Drug use: Not on file     Allergies   Patient has no known allergies.   Review of Systems Review of Systems   Physical Exam Triage Vital Signs ED Triage Vitals  Enc Vitals Group     BP 10/10/19 1125 140/84     Pulse Rate 10/10/19 1125 78     Resp 10/10/19 1125 16     Temp 10/10/19 1125 98.9 F (37.2 C)     Temp Source  10/10/19 1125 Oral     SpO2 10/10/19 1125 100 %     Weight --      Height --      Head Circumference --      Peak Flow --      Pain Score 10/10/19 1122 4     Pain Loc --      Pain Edu? --      Excl. in GC? --    No data found.  Updated Vital Signs BP 140/84 (BP Location: Right Arm)   Pulse 78   Temp 98.9 F (37.2 C) (Oral)   Resp 16   SpO2 100%   Visual Acuity Right Eye Distance:   Left Eye Distance:   Bilateral Distance:    Right Eye Near:   Left Eye Near:    Bilateral Near:     Physical Exam Vitals and nursing note reviewed.  Constitutional:      General: She is not in acute distress.    Appearance: Normal appearance. She is not ill-appearing, toxic-appearing or diaphoretic.  HENT:     Head: Normocephalic.     Nose: Nose normal.  Eyes:     Conjunctiva/sclera: Conjunctivae normal.  Pulmonary:     Effort: Pulmonary effort is normal.  Musculoskeletal:        General: Normal range of motion.     Cervical back: Normal range of motion.  Skin:    General: Skin is warm and dry.     Findings: No rash.  Neurological:     Mental Status: She is alert.  Psychiatric:        Mood and Affect: Mood normal.      UC Treatments / Results  Labs (all labs ordered are listed, but only abnormal results are displayed) Labs Reviewed  URINE CULTURE    EKG   Radiology No results found.  Procedures Procedures (including critical care time)  Medications Ordered in UC Medications - No data to display  Initial Impression / Assessment and Plan / UC Course  I have reviewed the triage vital signs and the nursing notes.  Pertinent labs & imaging results that were available during my care of the patient were reviewed by me and considered in my medical decision making (see chart for details).     Acute cystitis  Urine with large leuks, positive nitrates and blood Sending for culture.  Will treat with Keflex twice a day for 5 days. Recommended push fluids and AZO  over-the-counter for symptoms Follow up as needed for continued or worsening symptoms  Final Clinical Impressions(s) / UC Diagnoses   Final diagnoses:  Acute cystitis with hematuria     Discharge Instructions     Treating you for a urinary tract infection.  We will send for culture and sensitivity. Take the Keflex twice a day for 5 days. Drink plenty of water.  Follow up as needed for continued or worsening symptoms     ED Prescriptions    Medication Sig Dispense Auth. Provider   cephALEXin (KEFLEX) 500 MG capsule Take 1 capsule (500 mg total) by mouth 2 (two) times daily. 28 capsule Dlisa Barnwell A, NP     PDMP not reviewed this encounter.   Dahlia Byes A, NP 10/11/19 1043

## 2019-10-12 LAB — URINE CULTURE: Culture: >=100000 — AB

## 2019-10-16 ENCOUNTER — Encounter: Payer: Self-pay | Admitting: Obstetrics and Gynecology

## 2019-10-16 ENCOUNTER — Other Ambulatory Visit: Payer: Self-pay

## 2019-10-16 ENCOUNTER — Ambulatory Visit: Payer: Managed Care, Other (non HMO) | Admitting: Obstetrics and Gynecology

## 2019-10-16 VITALS — BP 168/94 | HR 96 | Ht 64.0 in | Wt 170.4 lb

## 2019-10-16 DIAGNOSIS — N921 Excessive and frequent menstruation with irregular cycle: Secondary | ICD-10-CM | POA: Diagnosis not present

## 2019-10-16 NOTE — Progress Notes (Signed)
Patient presents for follow up for abnormal bleeding. She is still taking her megace. She reports still having some bleeding but not as much. She states she did have some bleeding from 6/2-7/25. Bleeding was light. Patient would like to discuss scheduling her D/C.

## 2019-10-16 NOTE — Patient Instructions (Signed)
Endometrial Ablation, Care After This sheet gives you information about how to care for yourself after your procedure. Your health care provider may also give you more specific instructions. If you have problems or questions, contact your health care provider. What can I expect after the procedure? After the procedure, it is common to have:  A need to urinate more frequently than usual for the first 24 hours.  Cramps similar to menstrual cramps. These may last for 1-2 days.  Thin, watery vaginal discharge that is light pink or brown in color. This may last a few weeks. Discharge will be heavy for the first few days after your procedure. You may need to wear a sanitary pad.  Nausea.  Vaginal bleeding for 4-6 weeks after the procedure, as tissue healing occurs. Follow these instructions at home: Activity  Do not drive for 24 hours if you were given amedicine to help you relax (sedative) during your procedure.  Do not have sex or put anything into your vagina until your health care provider approves.  Do not lift anything that is heavier than 10 lb (4.5 kg), or the limit that you are told, until your health care provider says that it is safe.  Return to your normal activities as told by your health care provider. Ask your health care provider what activities are safe for you. General instructions   Take over-the-counter and prescription medicines only as told by your health care provider.  Do not take baths, swim, or use a hot tub until your health care provider approves. You will be able to take showers.  Check your vaginal area every day for signs of infection. Check for: ? Redness, swelling, or pain. ? More discharge or blood, instead of less. ? Bad-smelling discharge.  Keep all follow-up visits as told by your health care provider. This is important.  Drink enough fluid to keep your urine pale yellow. Contact a health care provider if you have:  Vaginal redness, swelling, or  pain.  Vaginal discharge or bleeding that gets worse instead of getting better.  Bad-smelling vaginal discharge.  A fever or chills.  Trouble urinating. Get help right away if you have:  Heavy vaginal bleeding.  Severe cramps. Summary  After endometrial ablation, it is normal to have thin, watery vaginal discharge that is light pink or brown in color. This may last a few weeks and may be heavier right after the procedure.  Vaginal bleeding is also normal after the procedure and should get better with time.  Check your vaginal area every day for signs of infection, such as bad-smelling discharge.  Keep all follow-up visits as told by your health care provider. This is important. This information is not intended to replace advice given to you by your health care provider. Make sure you discuss any questions you have with your health care provider. Document Revised: 06/29/2018 Document Reviewed: 01/18/2017 Elsevier Patient Education  2020 [employer]. Endometrial Ablation Endometrial ablation is a procedure that destroys the thin inner layer of the lining of the uterus (endometrium). This procedure may be done:  To stop heavy periods.  To stop bleeding that is causing anemia.  To control irregular bleeding.  To treat bleeding caused by small tumors (fibroids) in the endometrium. This procedure is often an alternative to major surgery, such as removal of the uterus and cervix (hysterectomy). As a result of this procedure:  You may not be able to have children. However, if you are premenopausal (you have not gone  through menopause): ? You may still have a small chance of getting pregnant. ? You will need to use a reliable method of birth control after the procedure to prevent pregnancy.  You may stop having a menstrual period, or you may have only a small amount of bleeding during your period. Menstruation may return several years after the procedure. Tell a health care  provider about:  Any allergies you have.  All medicines you are taking, including vitamins, herbs, eye drops, creams, and over-the-counter medicines.  Any problems you or family members have had with the use of anesthetic medicines.  Any blood disorders you have.  Any surgeries you have had.  Any medical conditions you have. What are the risks? Generally, this is a safe procedure. However, problems may occur, including:  A hole (perforation) in the uterus or bowel.  Infection of the uterus, bladder, or vagina.  Bleeding.  Damage to other structures or organs.  An air bubble in the lung (air embolus).  Problems with pregnancy after the procedure.  Failure of the procedure.  Decreased ability to diagnose cancer in the endometrium. What happens before the procedure?  You will have tests of your endometrium to make sure there are no pre-cancerous cells or cancer cells present.  You may have an ultrasound of the uterus.  You may be given medicines to thin the endometrium.  Ask your health care provider about: ? Changing or stopping your regular medicines. This is especially important if you take diabetes medicines or blood thinners. ? Taking medicines such as aspirin and ibuprofen. These medicines can thin your blood. Do not take these medicines before your procedure if your doctor tells you not to.  Plan to have someone take you home from the hospital or clinic. What happens during the procedure?   You will lie on an exam table with your feet and legs supported as in a pelvic exam.  To lower your risk of infection: ? Your health care team will wash or sanitize their hands and put on germ-free (sterile) gloves. ? Your genital area will be washed with soap.  An IV tube will be inserted into one of your veins.  You will be given a medicine to help you relax (sedative).  A surgical instrument with a light and camera (resectoscope) will be inserted into your vagina and  moved into your uterus. This allows your surgeon to see inside your uterus.  Endometrial tissue will be removed using one of the following methods: ? Radiofrequency. This method uses a radiofrequency-alternating electric current to remove the endometrium. ? Cryotherapy. This method uses extreme cold to freeze the endometrium. ? Heated-free liquid. This method uses a heated saltwater (saline) solution to remove the endometrium. ? Microwave. This method uses high-energy microwaves to heat up the endometrium and remove it. ? Thermal balloon. This method involves inserting a catheter with a balloon tip into the uterus. The balloon tip is filled with heated fluid to remove the endometrium. The procedure may vary among health care providers and hospitals. What happens after the procedure?  Your blood pressure, heart rate, breathing rate, and blood oxygen level will be monitored until the medicines you were given have worn off.  As tissue healing occurs, you may notice vaginal bleeding for 4-6 weeks after the procedure. You may also experience: ? Cramps. ? Thin, watery vaginal discharge that is light pink or brown in color. ? A need to urinate more frequently than usual. ? Nausea.  Do not drive for  24 hours if you were given a sedative.  Do not have sex or insert anything into your vagina until your health care provider approves. Summary  Endometrial ablation is done to treat the many causes of heavy menstrual bleeding.  The procedure may be done only after medications have been tried to control the bleeding.  Plan to have someone take you home from the hospital or clinic. This information is not intended to replace advice given to you by your health care provider. Make sure you discuss any questions you have with your health care provider. Document Revised: 08/23/2017 Document Reviewed: 03/25/2016 Elsevier Patient Education  [employer]. Hysteroscopy Hysteroscopy is a procedure that  is used to examine the inside of a woman's womb (uterus). This may be done for various reasons, including:  To look for lumps (tumors) and other growths in the uterus.  To evaluate abnormal bleeding, fibroid tumors, polyps, scar tissue (adhesions), or cancer of the uterus.  To determine the cause of an inability to get pregnant (infertility) or repeated losses of pregnancies (miscarriages).  To find a lost IUD (intrauterine device).  To perform a procedure that permanently prevents pregnancy (sterilization). During this procedure, a thin, flexible tube with a small light and camera (hysteroscope) is used to examine the uterus. The camera sends images to a monitor in the room so that your health care provider can view the inside of your uterus. A hysteroscopy should be done right after a menstrual period to make sure that you are not pregnant. Tell a health care provider about:  Any allergies you have.  All medicines you are taking, including vitamins, herbs, eye drops, creams, and over-the-counter medicines.  Any problems you or family members have had with the use of anesthetic medicines.  Any blood disorders you have.  Any surgeries you have had.  Any medical conditions you have.  Whether you are pregnant or may be pregnant. What are the risks? Generally, this is a safe procedure. However, problems may occur, including:  Excessive bleeding.  Infection.  Damage to the uterus or other structures or organs.  Allergic reaction to medicines or fluids that are used in the procedure. What happens before the procedure? Staying hydrated Follow instructions from your health care provider about hydration, which may include:  Up to 2 hours before the procedure - you may continue to drink clear liquids, such as water, clear fruit juice, black coffee, and plain tea. Eating and drinking restrictions Follow instructions from your health care provider about eating and drinking, which may  include:  8 hours before the procedure - stop eating solid foods and drink clear liquids only  2 hours before the procedure - stop drinking clear liquids. General instructions  Ask your health care provider about: ? Changing or stopping your normal medicines. This is important if you take diabetes medicines or blood thinners. ? Taking medicines such as aspirin and ibuprofen. These medicines can thin your blood and cause bleeding. Do not take these medicines for 1 week before your procedure, or as told by your health care provider.  Do not use any products that contain nicotine or tobacco for 2 weeks before the procedure. This includes cigarettes and e-cigarettes. If you need help quitting, ask your health care provider.  Medicine may be placed in your cervix the day before the procedure. This medicine causes the cervix to have a larger opening (dilate). The larger opening makes it easier for the hysteroscope to be inserted into the uterus during  the procedure.  Plan to have someone with you for the first 24-48 hours after the procedure, especially if you are given a medicine to make you fall asleep (general anesthetic).  Plan to have someone take you home from the hospital or clinic. What happens during the procedure?  To lower your risk of infection: ? Your health care team will wash or sanitize their hands. ? Your skin will be washed with soap. ? Hair may be removed from the surgical area.  An IV tube will be inserted into one of your veins.  You may be given one or more of the following: ? A medicine to help you relax (sedative). ? A medicine that numbs the area around the cervix (local anesthetic). ? A medicine to make you fall asleep (general anesthetic).  A hysteroscope will be inserted through your vagina and into your uterus.  Air or fluid will be used to enlarge your uterus, enabling your health care provider to see your uterus better. The amount of fluid used will be  carefully checked throughout the procedure.  In some cases, tissue may be gently scraped from inside the uterus and sent to a lab for testing (biopsy). The procedure may vary among health care providers and hospitals. What happens after the procedure?  Your blood pressure, heart rate, breathing rate, and blood oxygen level will be monitored until the medicines you were given have worn off.  You may have some cramping. You may be given medicines for this.  You may have bleeding, which varies from light spotting to menstrual-like bleeding. This is normal.  If you had a biopsy done, it is your responsibility to get the results of your procedure. Ask your health care provider, or the department performing the procedure, when your results will be ready. Summary  Hysteroscopy is a procedure that is used to examine the inside of a woman's womb (uterus).  After the procedure, you may have bleeding, which varies from light spotting to menstrual-like bleeding. This is normal. You may also have cramping.  Plan to have someone take you home from the hospital or clinic. This information is not intended to replace advice given to you by your health care provider. Make sure you discuss any questions you have with your health care provider. Document Revised: 02/18/2017 Document Reviewed: 04/06/2016 Elsevier Patient Education  2020 [employer] Hysteroscopy, Care After This sheet gives you information about how to care for yourself after your procedure. Your health care provider may also give you more specific instructions. If you have problems or questions, contact your health care provider. What can I expect after the procedure? After the procedure, it is common to have:  Cramping.  Bleeding. This can vary from light spotting to menstrual-like bleeding. Follow these instructions at home: Activity  Rest for 1-2 days after the procedure.  Do not douche, use tampons, or have sex for 2 weeks after  the procedure, or until your health care provider approves.  Do not drive for 24 hours after the procedure, or for as long as told by your health care provider.  Do not drive, use heavy machinery, or drink alcohol while taking prescription pain medicines. Medicines   Take over-the-counter and prescription medicines only as told by your health care provider.  Do not take aspirin during recovery. It can increase the risk of bleeding. General instructions  Do not take baths, swim, or use a hot tub until your health care provider approves. Take showers instead of baths for  2 weeks, or for as long as told by your health care provider.  To prevent or treat constipation while you are taking prescription pain medicine, your health care provider may recommend that you: ? Drink enough fluid to keep your urine clear or pale yellow. ? Take over-the-counter or prescription medicines. ? Eat foods that are high in fiber, such as fresh fruits and vegetables, whole grains, and beans. ? Limit foods that are high in fat and processed sugars, such as fried and sweet foods.  Keep all follow-up visits as told by your health care provider. This is important. Contact a health care provider if:  You feel dizzy or lightheaded.  You feel nauseous.  You have abnormal vaginal discharge.  You have a rash.  You have pain that does not get better with medicine.  You have chills. Get help right away if:  You have bleeding that is heavier than a normal menstrual period.  You have a fever.  You have pain or cramps that get worse.  You develop new abdominal pain.  You faint.  You have pain in your shoulders.  You have shortness of breath. Summary  After the procedure, you may have cramping and some vaginal bleeding.  Do not douche, use tampons, or have sex for 2 weeks after the procedure, or until your health care provider approves.  Do not take baths, swim, or use a hot tub until your health  care provider approves. Take showers instead of baths for 2 weeks, or for as long as told by your health care provider.  Report any unusual symptoms to your health care provider.  Keep all follow-up visits as told by your health care provider. This is important. This information is not intended to replace advice given to you by your health care provider. Make sure you discuss any questions you have with your health care provider. Document Revised: 02/18/2017 Document Reviewed: 04/06/2016 Elsevier Patient Education  2020 [employer].

## 2019-10-16 NOTE — Progress Notes (Signed)
Patient ID: Melanie [name], female   DOB: 1965-09-04, 54 y.o.   MRN: [id number] [name] presents for follow up of her menorrhagia U/S normal. EMBX normal Bleeding a little better with Megace.  PE AF VSS Lungs clear Heart RRR Abd soft + BS  A/P Menorrhagia  Tx options reviewed with pt including medication, IUD and surgery. Pt desires Hysteroscopy and endometrial ablation. R/B/Future [facility] evaluation and post op care reviewed. Pt verbalized understanding. Will schedule. F/U with post op appt. Continue with Megace

## 2019-10-17 ENCOUNTER — Encounter (HOSPITAL_BASED_OUTPATIENT_CLINIC_OR_DEPARTMENT_OTHER): Payer: Self-pay | Admitting: Obstetrics and Gynecology

## 2019-10-18 ENCOUNTER — Other Ambulatory Visit: Payer: Self-pay

## 2019-10-18 ENCOUNTER — Encounter (HOSPITAL_BASED_OUTPATIENT_CLINIC_OR_DEPARTMENT_OTHER): Payer: Self-pay | Admitting: Obstetrics and Gynecology

## 2019-10-18 NOTE — H&P (Signed)
Melanie [name] is an 54 y.o. female G2P1011 with heavy cycles. U/S revealed thicken endometrium and possible endometrial polyp, o/w normal. EMBX normal. Tx options reviewed with pt. Pt elected to proceed with hysteroscopy, D & C with endometrial ablation.  TSVD x 1  SAB x 1  H/O renal transplant and HTN, followed by nephrology   Menstrual History: Menarche age: 8 Patient's last menstrual period was 10/14/2019 (exact date).    Past Medical History:  Diagnosis Date  . Anxiety   . Blood transfusion without reported diagnosis   . Dyspnea   . Headache   . 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 Neuromuscular disorder (HCC)   . Renal disorder   . Sleep apnea     Past Surgical History:  Procedure Laterality Date  . DILATION AND CURETTAGE OF UTERUS    . KIDNEY TRANSPLANT  2013  . NEPHRECTOMY TRANSPLANTED ORGAN      Family History  Problem Relation Age of Onset  . Diabetes Father   . Hypertension Father   . Alcohol abuse Maternal Grandmother   . Alcohol abuse Maternal Grandfather   . Alcohol abuse Paternal Grandmother   . Diabetes Paternal Grandmother   . Alcohol abuse Paternal Grandfather     Social History:  reports that she has been smoking cigars. She has never used smokeless tobacco. She reports current alcohol use. She reports current drug use. Drug: Marijuana.  Allergies: No Known Allergies  No medications prior to admission.    Review of Systems  Constitutional: Negative.   Respiratory: Negative.   Cardiovascular: Negative.   Gastrointestinal: Negative.   Genitourinary: Positive for menstrual problem.    Height 5\' 4"  (1.626 m), weight 77.1 kg, last menstrual period 10/14/2019. Physical Exam Constitutional:      Appearance: Normal appearance.  Cardiovascular:     Rate and Rhythm: Normal rate and regular rhythm.  Pulmonary:     Effort: Pulmonary effort is normal.     Breath sounds: Normal breath sounds.  Abdominal:     General: Abdomen is flat.      Palpations: Abdomen is soft.  Genitourinary:    Comments: Deferred to OR Neurological:     Mental Status: She is alert.     No results found for this or any previous visit (from the past 24 hour(s)).  No results found.  Assessment/Plan: Heavy Cycles  Hysteroscopy, D & C and endometrial ablation reviewed with pt. R/B/Post op care reviewed. Potential difficult EMBX in the future reviewed. Pt has verbalized understanding and desires to proceed,  10/16/2019 10/18/2019, 11:07 AM

## 2019-10-20 ENCOUNTER — Other Ambulatory Visit (HOSPITAL_COMMUNITY): Payer: Managed Care, Other (non HMO)

## 2019-10-23 ENCOUNTER — Telehealth: Payer: Self-pay

## 2019-10-23 NOTE — Telephone Encounter (Signed)
Called patient to confirm her intentions to have surgery, no answer, left voicemail.

## 2019-10-23 NOTE — Progress Notes (Signed)
Patient has not gone for COVID test for surgery tomorrow 10/24/19, left patient a voicemail reminding her of the covid test as well as pre op blood work. Left message to notify the surgeon with Rhea Bleacher at Dr. Waynard Edwards office.

## 2019-10-23 NOTE — Telephone Encounter (Signed)
-----   Message from Jene Every, RN sent at 10/23/2019  9:59 AM EDT ----- Regarding: Patient for 10/24/19 [name], just making you aware that Dr. Waynard Edwards patient for tomorrow, Melanie [name], has not gone for her COVID test. I called her and left a voicemail but wanted you all to be aware incase she does not go for testing. She also needs Pre Op blood work which I told her in the voicemail as well. Thanks

## 2019-10-24 ENCOUNTER — Ambulatory Visit (HOSPITAL_BASED_OUTPATIENT_CLINIC_OR_DEPARTMENT_OTHER)
Admission: RE | Admit: 2019-10-24 | Payer: Managed Care, Other (non HMO) | Source: Home / Self Care | Admitting: Obstetrics and Gynecology

## 2019-10-24 SURGERY — DILATATION & CURETTAGE/HYSTEROSCOPY WITH NOVASURE ABLATION
Anesthesia: Choice

## 2020-05-26 ENCOUNTER — Encounter (HOSPITAL_COMMUNITY): Payer: Self-pay

## 2020-05-26 ENCOUNTER — Ambulatory Visit (INDEPENDENT_AMBULATORY_CARE_PROVIDER_SITE_OTHER): Payer: Managed Care, Other (non HMO)

## 2020-05-26 ENCOUNTER — Ambulatory Visit (HOSPITAL_COMMUNITY)
Admission: EM | Admit: 2020-05-26 | Discharge: 2020-05-26 | Disposition: A | Discharge status: Home / Self Care | Payer: Managed Care, Other (non HMO) | Attending: Family Medicine | Admitting: Family Medicine

## 2020-05-26 ENCOUNTER — Other Ambulatory Visit: Payer: Self-pay

## 2020-05-26 DIAGNOSIS — M7989 Other specified soft tissue disorders: Secondary | ICD-10-CM

## 2020-05-26 DIAGNOSIS — M7731 Calcaneal spur, right foot: Secondary | ICD-10-CM | POA: Diagnosis not present

## 2020-05-26 DIAGNOSIS — M25571 Pain in right ankle and joints of right foot: Secondary | ICD-10-CM

## 2020-05-26 DIAGNOSIS — M25461 Effusion, right knee: Secondary | ICD-10-CM | POA: Diagnosis not present

## 2020-05-26 DIAGNOSIS — M79671 Pain in right foot: Secondary | ICD-10-CM | POA: Diagnosis not present

## 2020-05-26 DIAGNOSIS — M25561 Pain in right knee: Secondary | ICD-10-CM | POA: Diagnosis not present

## 2020-05-26 MED ORDER — PREDNISONE 20 MG PO TABS
40.0000 mg | ORAL_TABLET | Freq: Every day | ORAL | 0 refills | Status: DC
Start: 2020-05-26 — End: 2020-06-11

## 2020-05-26 NOTE — ED Provider Notes (Signed)
[facility]    CSN: [id number] Arrival date & time: 05/26/20  1023      History   Chief Complaint No chief complaint on file.   HPI Melanie [name] is a 55 y.o. female.   Patient here today with about 6-day history of right anterior knee pain, right dorsal foot pain, heel pain.  No known injuries or inciting events for any of these issues.  Mild swelling initially to the right knee but this has resolved at this time.  So far has been taking Tylenol with mild temporary relief.  No known history of arthritis or traumatic injury to any of these areas.  Denies swelling, numbness, tingling, discoloration, history of blood clots, recent surgery, sedentary behavior.     Past Medical History:  Diagnosis Date  . Anxiety   . Blood transfusion without reported diagnosis   . Dyspnea   . Headache   . 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 Neuromuscular disorder (HCC)   . Renal disorder   . Sleep apnea     Patient Active Problem List   Diagnosis Date Noted  . Visit for routine gyn exam 06/29/2019  . Screening mammogram, encounter for 06/29/2019  . Menorrhagia 06/29/2019    Past Surgical History:  Procedure Laterality Date  . DILATION AND CURETTAGE OF UTERUS    . KIDNEY TRANSPLANT  2013  . NEPHRECTOMY TRANSPLANTED ORGAN      OB History    Gravida  2   Para  1   Term  1   Preterm      AB  1   Living  1     SAB      IAB      Ectopic      Multiple      Live Births  1            Home Medications    Prior to Admission medications   Medication Sig Start Date End Date Taking? Authorizing Provider  predniSONE (DELTASONE) 20 MG tablet Take 2 tablets (40 mg total) by mouth daily with breakfast. 05/26/20  Yes Particia Nearing, PA-C  calcium-vitamin D (OSCAL WITH D) 500-200 MG-UNIT tablet Take 1 tablet by mouth daily with breakfast.    [provider]  cephALEXin (KEFLEX) 500 MG capsule Take 1 capsule (500 mg total) by mouth 2 (two) times  daily. 10/10/19   Dahlia Byes A, NP  famotidine (PEPCID) 20 MG tablet Take 20 mg by mouth daily.    [provider]  ferrous sulfate 325 (65 FE) MG tablet Take 325 mg by mouth daily with breakfast.    [provider]  megestrol (MEGACE) 40 MG tablet Take 0.5 tablets (20 mg total) by mouth daily. Can increase to two tablets twice a day in the event of heavy bleeding 07/02/19   Hermina Staggers, MD  metoprolol tartrate (LOPRESSOR) 100 MG tablet Take 100 mg by mouth 2 (two) times daily.    [provider]  mycophenolate (MYFORTIC) 360 MG TBEC EC tablet Take 360 mg by mouth 2 (two) times daily.    [provider]  NIFEdipine (ADALAT CC) 60 MG 24 hr tablet Take 60 mg by mouth 2 (two) times daily.    [provider]  NIFEdipine (PROCARDIA XL/NIFEDICAL XL) 60 MG 24 hr tablet Take 60 mg by mouth 2 (two) times daily. 09/11/19   [provider]  potassium chloride SA (K-DUR) 20 MEQ tablet Take 20 mEq by mouth 2 (  two) times daily.    [provider]  predniSONE (DELTASONE) 5 MG tablet Take 5 mg by mouth daily with breakfast.    [provider]  simvastatin (ZOCOR) 20 MG tablet Take 20 mg by mouth daily.    [provider]  tacrolimus (PROGRAF) 1 MG capsule Take 7 mg by mouth 2 (two) times daily. 3 mg in am and 4 mg in pm    [provider]    Family History Family History  Problem Relation Age of Onset  . Diabetes Father   . Hypertension Father   . Alcohol abuse Maternal Grandmother   . Alcohol abuse Maternal Grandfather   . Alcohol abuse Paternal Grandmother   . Diabetes Paternal Grandmother   . Alcohol abuse Paternal Grandfather     Social History Social History   Tobacco Use  . Smoking status: Light Tobacco Smoker    Types: Cigars  . Smokeless tobacco: Never Used  . Tobacco comment: 3 per day  Vaping Use  . Vaping Use: Never used  Substance Use Topics  . Alcohol use: Yes    Comment: 3 per day  . Drug  use: Yes    Types: Marijuana    Comment: 4 times per week     Allergies   Patient has no known allergies.   Review of Systems Review of Systems Per HPI  Physical Exam Triage Vital Signs ED Triage Vitals  Enc Vitals Group     BP 05/26/20 1233 (!) 140/97     Pulse Rate 05/26/20 1233 81     Resp 05/26/20 1233 18     Temp 05/26/20 1233 98 F (36.7 C)     Temp src --      SpO2 05/26/20 1233 98 %     Weight --      Height --      Head Circumference --      Peak Flow --      Pain Score 05/26/20 1232 6     Pain Loc --      Pain Edu? --      Excl. in GC? --    No data found.  Updated Vital Signs BP (!) 140/97 (BP Location: Right Arm)   Pulse 81   Temp 98 F (36.7 C)   Resp 18   LMP 05/26/2020   SpO2 98%   Visual Acuity Right Eye Distance:   Left Eye Distance:   Bilateral Distance:    Right Eye Near:   Left Eye Near:    Bilateral Near:     Physical Exam Vitals and nursing note reviewed.  Constitutional:      Appearance: Normal appearance. She is not ill-appearing.  HENT:     Head: Atraumatic.  Eyes:     Extraocular Movements: Extraocular movements intact.     Conjunctiva/sclera: Conjunctivae normal.  Cardiovascular:     Rate and Rhythm: Normal rate and regular rhythm.     Pulses: Normal pulses.     Heart sounds: Normal heart sounds.  Pulmonary:     Effort: Pulmonary effort is normal.     Breath sounds: Normal breath sounds.  Musculoskeletal:        General: Tenderness present. No swelling, deformity or signs of injury. Normal range of motion.     Cervical back: Normal range of motion and neck supple.     Comments: Right anterior knee tender to palpation peripatellar region Left lateral Achilles region tender to palpation   Skin:  General: Skin is warm and dry.     Findings: No erythema or rash.  Neurological:     Mental Status: She is alert and oriented to person, place, and time.     Sensory: No sensory deficit.     Motor: No weakness.   Psychiatric:        Mood and Affect: Mood normal.        Thought Content: Thought content normal.        Judgment: Judgment normal.      UC Treatments / Results  Labs (all labs ordered are listed, but only abnormal results are displayed) Labs Reviewed - No data to display  EKG   Radiology DG Ankle Complete Right  Result Date: 05/26/2020 CLINICAL DATA:  Four-day history of RIGHT ankle pain. No known injuries. EXAM: RIGHT ANKLE - COMPLETE 3+ VIEW COMPARISON:  None. FINDINGS: No evidence of acute fracture. Tibiotalar joint anatomically aligned with well-preserved joint space. Well-preserved bone mineral density. Moderate to large plantar calcaneal spur. Small enthesopathic spur at the insertion of the Achilles tendon on the POSTERIOR calcaneus. No visible joint effusion. IMPRESSION: 1. No acute osseous abnormality. 2. Moderate to large plantar calcaneal spur. Electronically Signed   By: Hulan Saas M.D.   On: 05/26/2020 13:58   DG Knee Complete 4 Views Right  Result Date: 05/26/2020 CLINICAL DATA:  Four-day history of RIGHT knee pain and swelling. Pain localizes to the patella. No known injury. EXAM: RIGHT KNEE - COMPLETE 4+ VIEW COMPARISON:  None. FINDINGS: No evidence of acute, subacute or healed fractures. Joint spaces well-preserved. Bone mineral density well-preserved. No intrinsic osseous abnormality. No visible joint effusion. Femoropopliteal and tibioperoneal artery atherosclerosis. IMPRESSION: 1. No osseous abnormality. 2. Femoropopliteal and tibioperoneal artery atherosclerosis. Electronically Signed   By: Hulan Saas M.D.   On: 05/26/2020 13:57   DG Foot Complete Right  Result Date: 05/26/2020 CLINICAL DATA:  Anterior foot pain EXAM: RIGHT FOOT COMPLETE - 3+ VIEW COMPARISON:  None. FINDINGS: There is no evidence of fracture or dislocation. Small bidirectional calcaneal enthesophytes. There is no evidence of arthropathy or other focal bone abnormality. Soft tissues are  unremarkable. IMPRESSION: Negative. Electronically Signed   By: Duanne Guess D.O.   On: 05/26/2020 13:58    Procedures Procedures (including critical care time)  Medications Ordered in UC Medications - No data to display  Initial Impression / Assessment and Plan / UC Course  I have reviewed the triage vital signs and the nursing notes.  Pertinent labs & imaging results that were available during my care of the patient were reviewed by me and considered in my medical decision making (see chart for details).     Patient requested x-rays of all 3 areas today.  Ankle x-ray showing calcaneal spurs which could likely be because of her ankle, possibly even foot pain at this time.  Suspect need to be inflammatory in nature.  X-ray not showing any chronic bony changes or new injuries today.  We will treat with prednisone burst, over-the-counter pain relievers, rice protocol.  Sports medicine follow-up if worsening or not resolving. Recommended podiatry follow-up if bone spurs in feet and ankles continue to bother her. Final Clinical Impressions(s) / UC Diagnoses   Final diagnoses:  Acute pain of right knee  Calcaneal spur, right   Discharge Instructions   None    ED Prescriptions    Medication Sig Dispense Auth. Provider   predniSONE (DELTASONE) 20 MG tablet Take 2 tablets (40 mg total) by mouth daily  with breakfast. 10 tablet Particia NearingLane, Amye Grego [place], [place]-C     PDMP not reviewed this encounter.   Particia NearingLane, Darnell Stimson [place], [place]-C 05/26/20 1827

## 2020-05-26 NOTE — ED Triage Notes (Signed)
Pt presents with right leg pain X 6 days. Pt states the pain radiates to the ankle. She denies swelling.

## 2020-06-11 ENCOUNTER — Other Ambulatory Visit: Payer: Self-pay

## 2020-06-11 ENCOUNTER — Encounter (HOSPITAL_COMMUNITY): Payer: Self-pay | Admitting: Emergency Medicine

## 2020-06-11 ENCOUNTER — Emergency Department (HOSPITAL_COMMUNITY)
Admission: EM | Admit: 2020-06-11 | Discharge: 2020-06-11 | Disposition: A | Discharge status: Home / Self Care | Payer: Managed Care, Other (non HMO) | Attending: Emergency Medicine | Admitting: Emergency Medicine

## 2020-06-11 ENCOUNTER — Emergency Department (HOSPITAL_COMMUNITY): Payer: Managed Care, Other (non HMO)

## 2020-06-11 DIAGNOSIS — K838 Other specified diseases of biliary tract: Secondary | ICD-10-CM | POA: Diagnosis not present

## 2020-06-11 DIAGNOSIS — R197 Diarrhea, unspecified: Secondary | ICD-10-CM | POA: Insufficient documentation

## 2020-06-11 DIAGNOSIS — R1012 Left upper quadrant pain: Secondary | ICD-10-CM | POA: Diagnosis present

## 2020-06-11 DIAGNOSIS — K859 Acute pancreatitis without necrosis or infection, unspecified: Secondary | ICD-10-CM | POA: Insufficient documentation

## 2020-06-11 DIAGNOSIS — F17290 Nicotine dependence, other tobacco product, uncomplicated: Secondary | ICD-10-CM | POA: Insufficient documentation

## 2020-06-11 LAB — COMPREHENSIVE METABOLIC PANEL
ALT: 24 U/L (ref 0–44)
AST: 35 U/L (ref 15–41)
Albumin: 3 g/dL — ABNORMAL LOW (ref 3.5–5.0)
Alkaline Phosphatase: 56 U/L (ref 38–126)
Anion gap: 10 (ref 5–15)
BUN: 12 mg/dL (ref 6–20)
CO2: 23 mmol/L (ref 22–32)
Calcium: 11 mg/dL — ABNORMAL HIGH (ref 8.9–10.3)
Chloride: 106 mmol/L (ref 98–111)
Creatinine, Ser: 1.01 mg/dL — ABNORMAL HIGH (ref 0.44–1.00)
GFR, Estimated: >60 mL/min (ref >60)
Glucose, Bld: 143 mg/dL — ABNORMAL HIGH (ref 70–99)
Potassium: 3.3 mmol/L — ABNORMAL LOW (ref 3.5–5.1)
Sodium: 139 mmol/L (ref 135–145)
Total Bilirubin: 1.1 mg/dL (ref 0.3–1.2)
Total Protein: 6.7 g/dL (ref 6.5–8.1)

## 2020-06-11 LAB — LIPASE, BLOOD: Lipase: 143 U/L — ABNORMAL HIGH (ref 11–51)

## 2020-06-11 LAB — CBC
HCT: 39 % (ref 36.0–46.0)
Hemoglobin: 13.7 g/dL (ref 12.0–15.0)
MCH: 34.5 pg — ABNORMAL HIGH (ref 26.0–34.0)
MCHC: 35.1 g/dL (ref 30.0–36.0)
MCV: 98.2 fL (ref 80.0–100.0)
Platelets: 261 10*3/uL (ref 150–400)
RBC: 3.97 MIL/uL (ref 3.87–5.11)
RDW: 11.9 % (ref 11.5–15.5)
WBC: 8.4 10*3/uL (ref 4.0–10.5)
nRBC: 0 % (ref 0.0–0.2)

## 2020-06-11 MED ORDER — ONDANSETRON 4 MG PO TBDP: 4.0000 mg | ORAL_TABLET | Freq: Three times a day (TID) | ORAL | 0 refills | Status: DC | PRN

## 2020-06-11 MED ORDER — SODIUM CHLORIDE 0.9 % IV BOLUS
1000.0000 mL | Freq: Once | INTRAVENOUS | Status: AC
  Administered 2020-06-11: 1000 mL via INTRAVENOUS

## 2020-06-11 MED ORDER — OXYCODONE-ACETAMINOPHEN 5-325 MG PO TABS: 1.0000 | ORAL_TABLET | Freq: Three times a day (TID) | ORAL | 0 refills | Status: AC | PRN

## 2020-06-11 MED ORDER — IOHEXOL 300 MG/ML  SOLN
100.0000 mL | Freq: Once | INTRAMUSCULAR | Status: AC | PRN
  Administered 2020-06-11: 100 mL via INTRAVENOUS

## 2020-06-11 MED ORDER — HYDROMORPHONE HCL 1 MG/ML IJ SOLN
0.5000 mg | Freq: Once | INTRAMUSCULAR | Status: AC
  Administered 2020-06-11: 0.5 mg via INTRAVENOUS
  Filled 2020-06-11: qty 1

## 2020-06-11 NOTE — Discharge Instructions (Addendum)
Take the medications as needed to help with your symptoms. Continue your home medications as previously prescribed. Return to the ER if you start to experience worsening abdominal pain, chest pain, shortness of breath or fever.

## 2020-06-11 NOTE — ED Provider Notes (Signed)
[facility]   CSN: [id number] Arrival date & time: 06/11/20  1317     History Chief Complaint  Patient presents with  . Abdominal Pain    Melanie PapLora [name] is a 55 y.o. female with a past medical history of anxiety, status post right kidney transplant about 10 years ago presenting to the ED with a chief complaint of abdominal pain.  States that for the past 6 days she has had a decreased appetite.  She then tells me this has been going on for the past several months actually leading to significant weight loss.  About 3 days ago started experiencing generalized abdominal pain that was worse in the upper abdomen.  She has had pain like this before but it typically resolves and has not sought help for it.  She reports associated nausea but denies any vomiting.  She states that she is alternating between diarrhea and constipation.  Denies any bloody stools.  She has tried taking Tylenol with only minimal improvement in her symptoms.  States that her daughter was complaining of abdominal pain as well but she is unsure if it is related as she has not eaten much.  She denies any urinary symptoms, chest pain, shortness of breath, fever, cough.  Prior abdominal surgeries include cholecystectomy. Patient states that she drinks alcohol on a daily basis and has been for several years.  Drinks about 2 beers and 2 shots every day after work.  She has never experienced alcohol withdrawal symptoms in the past.  HPI     Past Medical History:  Diagnosis Date  . Anxiety   . Blood transfusion without reported diagnosis   . Dyspnea   . Headache   . 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 Neuromuscular disorder (HCC)   . Renal disorder   . Sleep apnea     Patient Active Problem List   Diagnosis Date Noted  . Visit for routine gyn exam 06/29/2019  . Screening mammogram, encounter for 06/29/2019  . Menorrhagia 06/29/2019    Past Surgical History:   Procedure Laterality Date  . DILATION AND CURETTAGE OF UTERUS    . KIDNEY TRANSPLANT  2013  . NEPHRECTOMY TRANSPLANTED ORGAN       OB History    Gravida  2   Para  1   Term  1   Preterm      AB  1   Living  1     SAB      IAB      Ectopic      Multiple      Live Births  1           Family History  Problem Relation Age of Onset  . Diabetes Father   . Hypertension Father   . Alcohol abuse Maternal Grandmother   . Alcohol abuse Maternal Grandfather   . Alcohol abuse Paternal Grandmother   . Diabetes Paternal Grandmother   . Alcohol abuse Paternal Grandfather     Social History   Tobacco Use  . Smoking status: Light Tobacco Smoker    Types: Cigars  . Smokeless tobacco: Never Used  . Tobacco comment: 3 per day  Vaping Use  . Vaping Use: Never used  Substance Use Topics  . Alcohol use: Yes    Comment: 3 per day  . Drug use: Yes    Types: Marijuana    Comment: 4 times per week    Home Medications Prior to  Admission medications   Medication Sig Start Date End Date Taking? Authorizing Provider  famotidine (PEPCID) 20 MG tablet Take 20 mg by mouth as needed for heartburn or indigestion.   Yes [provider]  ferrous sulfate 325 (65 FE) MG tablet Take 325 mg by mouth daily with breakfast.   Yes [provider]  megestrol (MEGACE) 40 MG tablet Take 0.5 tablets (20 mg total) by mouth daily. Can increase to two tablets twice a day in the event of heavy bleeding 07/02/19  Yes Hermina Staggers, MD  metoprolol tartrate (LOPRESSOR) 100 MG tablet Take 100 mg by mouth 2 (two) times daily.   Yes [provider]  mycophenolate (MYFORTIC) 360 MG TBEC EC tablet Take 360 mg by mouth 2 (two) times daily.   Yes [provider]  NIFEdipine (ADALAT CC) 60 MG 24 hr tablet Take 60 mg by mouth 2 (two) times daily.   Yes [provider]  ondansetron (ZOFRAN ODT) 4 MG disintegrating tablet Take 1 tablet (4 mg total) by mouth every 8  (eight) hours as needed for nausea or vomiting. 06/11/20  Yes Cleola Perryman, PA-C  oxyCODONE-acetaminophen (PERCOCET/ROXICET) 5-325 MG tablet Take 1 tablet by mouth every 8 (eight) hours as needed for severe pain. 06/11/20  Yes Alex Mcmanigal, PA-C  potassium chloride SA (K-DUR) 20 MEQ tablet Take 20 mEq by mouth 2 (two) times daily.   Yes [provider]  predniSONE (DELTASONE) 5 MG tablet Take 5 mg by mouth daily with breakfast.   Yes [provider]  simvastatin (ZOCOR) 20 MG tablet Take 20 mg by mouth at bedtime.   Yes [provider]  tacrolimus (PROGRAF) 1 MG capsule Take 3-4 mg by mouth See admin instructions. Take  in the morning and  at night.   Yes [provider]    Allergies    Patient has no known allergies.  Review of Systems   Review of Systems  Constitutional: Negative for appetite change, chills and fever.  HENT: Negative for ear pain, rhinorrhea, sneezing and sore throat.   Eyes: Negative for photophobia and visual disturbance.  Respiratory: Negative for cough, chest tightness, shortness of breath and wheezing.   Cardiovascular: Negative for chest pain and palpitations.  Gastrointestinal: Positive for abdominal pain, constipation, diarrhea and nausea. Negative for blood in stool and vomiting.  Genitourinary: Negative for dysuria, hematuria and urgency.  Musculoskeletal: Negative for myalgias.  Skin: Negative for rash.  Neurological: Negative for dizziness, weakness and light-headedness.    Physical Exam Updated Vital Signs BP (!) 146/92   Pulse 87   Temp 99.5 F (37.5 C)   Resp 14   LMP 05/26/2020   SpO2 95%   Physical Exam Vitals and nursing note reviewed.  Constitutional:      General: She is not in acute distress.    Appearance: She is well-developed.  HENT:     Head: Normocephalic and atraumatic.     Nose: Nose normal.  Eyes:     General: No scleral icterus.       Left eye: No discharge.     Conjunctiva/sclera:  Conjunctivae normal.  Cardiovascular:     Rate and Rhythm: Normal rate and regular rhythm.     Heart sounds: Normal heart sounds. No murmur heard. No friction rub. No gallop.   Pulmonary:     Effort: Pulmonary effort is normal. No respiratory distress.     Breath sounds: Normal breath sounds.  Abdominal:     General: Bowel sounds  are normal. There is no distension.     Palpations: Abdomen is soft.     Tenderness: There is abdominal tenderness in the epigastric area and left upper quadrant. There is no guarding.  Musculoskeletal:        General: Normal range of motion.     Cervical back: Normal range of motion and neck supple.  Skin:    General: Skin is warm and dry.     Findings: No rash.  Neurological:     Mental Status: She is alert.     Motor: No abnormal muscle tone.     Coordination: Coordination normal.     ED Results / Procedures / Treatments   Labs (all labs ordered are listed, but only abnormal results are displayed) Labs Reviewed  LIPASE, BLOOD - Abnormal; Notable for the following components:      Result Value   Lipase 143 (*)    All other components within normal limits  COMPREHENSIVE METABOLIC PANEL - Abnormal; Notable for the following components:   Potassium 3.3 (*)    Glucose, Bld 143 (*)    Creatinine, Ser 1.01 (*)    Calcium 11.0 (*)    Albumin 3.0 (*)    All other components within normal limits  CBC - Abnormal; Notable for the following components:   MCH 34.5 (*)    All other components within normal limits  URINALYSIS, ROUTINE W REFLEX MICROSCOPIC    EKG None  Radiology CT ABDOMEN PELVIS W CONTRAST  Result Date: 06/11/2020 CLINICAL DATA:  Abdominal pain EXAM: CT ABDOMEN AND PELVIS WITH CONTRAST TECHNIQUE: Multidetector CT imaging of the abdomen and pelvis was performed using the standard protocol following bolus administration of intravenous contrast. CONTRAST:  OMNIPAQUE IOHEXOL 300 MG/ML  SOLN COMPARISON:  None. FINDINGS: Lower chest:  The visualized heart size within normal limits. No pericardial fluid/thickening. No hiatal hernia. The visualized portions of the lungs are clear. Hepatobiliary: The liver is normal in density without focal abnormality.The main portal vein is patent. The patient is status post cholecystectomy. There is a dilated common bile duct measuring 1.2 cm to the level of the ampulla. There is also mild intrahepatic biliary ductal dilatation. Pancreas: Mild pancreatic ductal dilatation is noted measuring up to 4 mm to the mid body. There is no significant fat stranding changes however noted. Spleen: Normal in size without focal abnormality. Adrenals/Urinary Tract: Both adrenal glands appear normal. There is atrophy of the bilateral native kidneys. Low-density lesions are seen within both kidneys the largest measuring 1 cm within the upper pole of the native right kidney. The transplant kidney is seen within the right lower pelvis. No hydronephrosis is noted. There are multiple low-density lesions seen throughout the transplant kidney. The largest measuring 1.8 cm. There is also a 6 mm calculus seen in the lower pole of the transplant kidney. The bladder is unremarkable. Stomach/Bowel: The stomach, small bowel, and colon are normal in appearance. No inflammatory changes, wall thickening, or obstructive findings.The appendix is normal. Vascular/Lymphatic: There are no enlarged mesenteric, retroperitoneal, or pelvic lymph nodes. Scattered aortic atherosclerotic calcifications are seen without aneurysmal dilatation. Reproductive: Heterogeneous enlarged uterus with multiple uterine fibroids are seen. Other: No there is a focal area of diastasis of the anterior abdominal wall with a portion of transverse colon abutting this area. Musculoskeletal: No acute or significant osseous findings. IMPRESSION: Status post cholecystectomy. Mild common bile duct and pancreatic ductal dilatation. No definite calcified stones however are noted.  However this could be due to  nonvisualized stone or stricture. If further evaluation is required would recommend dedicated nonemergent MRCP. No evidence of acute pancreatitis. Nonobstructing renal calculus within the right lower pelvic transplant kidney Fibroid uterus Aortic Atherosclerosis (ICD10-I70.0). Electronically Signed   By: Jonna Clark M.D.   On: 06/11/2020 17:34    Procedures Procedures   Medications Ordered in ED Medications  sodium chloride 0.9 % bolus 1,000 mL (1,000 mLs Intravenous New Bag/Given 06/11/20 1619)  HYDROmorphone (DILAUDID) injection 0.5 mg (0.5 mg Intravenous Given 06/11/20 1619)  iohexol (OMNIPAQUE) 300 MG/ML solution 100 mL (100 mLs Intravenous Contrast Given 06/11/20 1722)    ED Course  I have reviewed the triage vital signs and the nursing notes.  Pertinent labs & imaging results that were available during my care of the patient were reviewed by me and considered in my medical decision making (see chart for details).    MDM Rules/Calculators/A&P                          55 year old female who is status post right kidney transplant, history of alcohol abuse daily presenting to the ED with abdominal pain.  For the past few days has been having generalized abdominal pain that is worse in the upper abdomen.  Has had similar pain in the past that resolved on its own.  She reports nausea and alternating between diarrhea and constipation.  Denies any chest pain, shortness of breath, cough, urinary symptoms or fever.  On exam there is tenderness palpation of the epigastric and left upper quadrant area without rebound or guarding.  No CVA tenderness noted bilaterally.  Lab work significant for unremarkable CBC, CMP.  Lipase is elevated at 143.  Due to her alcohol use and prior cholecystectomy, I feel that her alcohol use is the cause of her pancreatitis today.  Will attempt to treat symptomatically while awaiting CT scan to evaluate for complicating features of pancreatitis  versus other cause of her pain.  CT scan shows possible CBD stone versus stricture.  No complicating features of pancreatitis.  Suspect that her symptoms are due to pancreatitis in setting of alcohol use.  Patient's symptoms controlled here with pain medication and IV fluids.  Offered admission for ongoing symptom control but patient declines and states that she would like to go home.  She is tolerating p.o. without difficulty here.  Will discharge home with antiemetics, pain medication and GI follow-up regarding this possible stricture versus stone.  I did inform her that her pancreatitis was most likely due to her alcohol use.  If she wishes to stop her alcohol use she should do it under the supervision of healthcare provider.  We will have her continue her home medications and return for worsening symptoms peer   Patient is hemodynamically stable, in NAD, and able to ambulate in the ED. Evaluation does not show pathology that would require ongoing emergent intervention or inpatient treatment. I explained the diagnosis to the patient. Pain has been managed and has no complaints prior to discharge. Patient is comfortable with above plan and is stable for discharge at this time. All questions were answered prior to disposition. Strict return precautions for returning to the ED were discussed. Encouraged follow up with PCP.   Prior to providing a prescription for a controlled substance, I independently reviewed the patient's recent prescription history on the [place] Controlled Substance Reporting System. The patient had no recent or regular prescriptions and was deemed appropriate for a brief, less  than 3 day prescription of narcotic for acute analgesia.  An After Visit Summary was printed and given to the patient.   Portions of this note were generated with Scientist, clinical (histocompatibility and immunogenetics). Dictation errors may occur despite best attempts at proofreading.  Final Clinical Impression(s) / ED  Diagnoses Final diagnoses:  Acute pancreatitis without infection or necrosis, unspecified pancreatitis type  Common bile duct dilatation    Rx / DC Orders ED Discharge Orders         Ordered    oxyCODONE-acetaminophen (PERCOCET/ROXICET) 5-325 MG tablet  Every 8 hours PRN        06/11/20 1808    ondansetron (ZOFRAN ODT) 4 MG disintegrating tablet  Every 8 hours PRN        06/11/20 1808           Dietrich Pates, PA-C 06/11/20 1809    Jacalyn Lefevre, MD 06/11/20 1810

## 2020-06-11 NOTE — ED Triage Notes (Signed)
Reports generalized abd pain since Monday with nausea and constipation.

## 2020-06-18 ENCOUNTER — Other Ambulatory Visit: Payer: Managed Care, Other (non HMO) | Admitting: Gastroenterology

## 2020-06-18 ENCOUNTER — Other Ambulatory Visit: Payer: Self-pay | Admitting: Gastroenterology

## 2020-06-18 DIAGNOSIS — K859 Acute pancreatitis without necrosis or infection, unspecified: Secondary | ICD-10-CM

## 2020-07-13 ENCOUNTER — Other Ambulatory Visit: Payer: Self-pay

## 2020-07-13 ENCOUNTER — Ambulatory Visit
Admission: RE | Admit: 2020-07-13 | Discharge: 2020-07-13 | Disposition: A | Discharge status: Home / Self Care | Payer: Managed Care, Other (non HMO) | Source: Ambulatory Visit | Attending: Gastroenterology | Admitting: Gastroenterology

## 2020-07-13 DIAGNOSIS — K859 Acute pancreatitis without necrosis or infection, unspecified: Secondary | ICD-10-CM

## 2020-07-13 MED ORDER — GADOBUTROL 1 MMOL/ML IV SOLN
10.0000 mL | Freq: Once | INTRAVENOUS | Status: AC | PRN
  Administered 2020-07-13: 10 mL via INTRAVENOUS

## 2020-07-31 ENCOUNTER — Other Ambulatory Visit: Payer: Self-pay

## 2020-07-31 ENCOUNTER — Ambulatory Visit (HOSPITAL_COMMUNITY)
Admission: RE | Admit: 2020-07-31 | Discharge: 2020-07-31 | Disposition: A | Discharge status: Home / Self Care | Payer: Managed Care, Other (non HMO) | Source: Ambulatory Visit | Attending: Family Medicine | Admitting: Family Medicine

## 2020-07-31 ENCOUNTER — Encounter (HOSPITAL_COMMUNITY): Payer: Self-pay

## 2020-07-31 VITALS — BP 135/84 | HR 77 | Temp 98.2°F | Resp 18

## 2020-07-31 DIAGNOSIS — H6123 Impacted cerumen, bilateral: Secondary | ICD-10-CM | POA: Diagnosis not present

## 2020-07-31 NOTE — ED Provider Notes (Signed)
[facility]    CSN: [id number] Arrival date & time: 07/31/20  0845      History   Chief Complaint Chief Complaint  Patient presents with  . Appointment    0900  . Headache  . Dizziness  . Ear Fullness    HPI Melanie [name] is a 55 y.o. female.   Patient presenting today with several day history of ear fullness, muffled hearing, feeling off balance.  She states this happens every time she gets her ears clogged with wax.  Has tried Debrox drops with no benefit.  Denies headache, visual changes, syncope, falls, chest pain, shortness of breath.     Past Medical History:  Diagnosis Date  . Anxiety   . Blood transfusion without reported diagnosis   . Dyspnea   . Headache   . 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 Neuromuscular disorder (HCC)   . Renal disorder   . Sleep apnea     Patient Active Problem List   Diagnosis Date Noted  . Visit for routine gyn exam 06/29/2019  . Screening mammogram, encounter for 06/29/2019  . Menorrhagia 06/29/2019    Past Surgical History:  Procedure Laterality Date  . DILATION AND CURETTAGE OF UTERUS    . KIDNEY TRANSPLANT  2013  . NEPHRECTOMY TRANSPLANTED ORGAN      OB History    Gravida  2   Para  1   Term  1   Preterm      AB  1   Living  1     SAB      IAB      Ectopic      Multiple      Live Births  1            Home Medications    Prior to Admission medications   Medication Sig Start Date End Date Taking? Authorizing Provider  famotidine (PEPCID) 20 MG tablet Take 20 mg by mouth as needed for heartburn or indigestion.    [provider]  ferrous sulfate 325 (65 FE) MG tablet Take 325 mg by mouth daily with breakfast.    [provider]  megestrol (MEGACE) 40 MG tablet Take 0.5 tablets (20 mg total) by mouth daily. Can increase to two tablets twice a day in the event of heavy bleeding 07/02/19   Hermina Staggers, MD  metoprolol tartrate (LOPRESSOR) 100 MG tablet Take 100 mg  by mouth 2 (two) times daily.    [provider]  mycophenolate (MYFORTIC) 360 MG TBEC EC tablet Take 360 mg by mouth 2 (two) times daily.    [provider]  NIFEdipine (ADALAT CC) 60 MG 24 hr tablet Take 60 mg by mouth 2 (two) times daily.    [provider]  ondansetron (ZOFRAN ODT) 4 MG disintegrating tablet Take 1 tablet (4 mg total) by mouth every 8 (eight) hours as needed for nausea or vomiting. 06/11/20   Khatri, Hina, PA-C  oxyCODONE-acetaminophen (PERCOCET/ROXICET) 5-325 MG tablet Take 1 tablet by mouth every 8 (eight) hours as needed for severe pain. 06/11/20   Khatri, Hina, PA-C  potassium chloride SA (K-DUR) 20 MEQ tablet Take 20 mEq by mouth 2 (two) times daily.    [provider]  predniSONE (DELTASONE) 5 MG tablet Take 5 mg by mouth daily with breakfast.    [provider]  simvastatin (ZOCOR) 20 MG tablet Take 20 mg by mouth at bedtime.    [provider]  tacrolimus (  PROGRAF) 1 MG capsule Take 3-4 mg by mouth See admin instructions. Take 3mg  in the morning and 4mg  at night.    [provider]    Family History Family History  Problem Relation Age of Onset  . Diabetes Father   . Hypertension Father   . Alcohol abuse Maternal Grandmother   . Alcohol abuse Maternal Grandfather   . Alcohol abuse Paternal Grandmother   . Diabetes Paternal Grandmother   . Alcohol abuse Paternal Grandfather     Social History Social History   Tobacco Use  . Smoking status: Light Tobacco Smoker    Types: Cigars  . Smokeless tobacco: Never Used  . Tobacco comment: 3 per day  Vaping Use  . Vaping Use: Never used  Substance Use Topics  . Alcohol use: Yes    Comment: 3 per day  . Drug use: Yes    Types: Marijuana    Comment: 4 times per week     Allergies   Patient has no known allergies.   Review of Systems Review of Systems Per HPI Physical Exam Triage Vital Signs ED Triage Vitals  Enc Vitals Group     BP  07/31/20 0857 135/84     Pulse Rate 07/31/20 0857 77     Resp 07/31/20 0857 18     Temp 07/31/20 0857 98.2 F (36.8 C)     Temp Source 07/31/20 0857 Oral     SpO2 07/31/20 0857 100 %     Weight --      Height --      Head Circumference --      Peak Flow --      Pain Score 07/31/20 0855 0     Pain Loc --      Pain Edu? --      Excl. in GC? --    No data found.  Updated Vital Signs BP 135/84 (BP Location: Right Arm)   Pulse 77   Temp 98.2 F (36.8 C) (Oral)   Resp 18   LMP 05/26/2020   SpO2 100%   Visual Acuity Right Eye Distance:   Left Eye Distance:   Bilateral Distance:    Right Eye Near:   Left Eye Near:    Bilateral Near:     Physical Exam Vitals and nursing note reviewed.  Constitutional:      Appearance: Normal appearance. She is not ill-appearing.  HENT:     Head: Atraumatic.     Right Ear: There is impacted cerumen.     Left Ear: There is impacted cerumen.     Nose: Nose normal.     Mouth/Throat:     Mouth: Mucous membranes are moist.     Pharynx: Oropharynx is clear.  Eyes:     Extraocular Movements: Extraocular movements intact.     Conjunctiva/sclera: Conjunctivae normal.  Cardiovascular:     Rate and Rhythm: Normal rate and regular rhythm.     Heart sounds: Normal heart sounds.  Pulmonary:     Effort: Pulmonary effort is normal.     Breath sounds: Normal breath sounds.  Musculoskeletal:        General: Normal range of motion.     Cervical back: Normal range of motion and neck supple.  Skin:    General: Skin is warm and dry.  Neurological:     Mental Status: She is alert and oriented to person, place, and time.  Psychiatric:        Mood and Affect: Mood normal.  Thought Content: Thought content normal.        Judgment: Judgment normal.     UC Treatments / Results  Labs (all labs ordered are listed, but only abnormal results are displayed) Labs Reviewed - No data to display  EKG   Radiology No results  found.  Procedures Procedures (including critical care time)  Medications Ordered in UC Medications - No data to display  Initial Impression / Assessment and Plan / UC Course  I have reviewed the triage vital signs and the nursing notes.  Pertinent labs & imaging results that were available during my care of the patient were reviewed by me and considered in my medical decision making (see chart for details).     Lavage performed today bilaterally with good benefit, full clearance of cerumen impaction.  TMs both visualized and benign.  Use Debrox drops as needed for maintenance at home.  Note given to return with no restrictions  Final Clinical Impressions(s) / UC Diagnoses   Final diagnoses:  Bilateral impacted cerumen   Discharge Instructions   None    ED Prescriptions    None     PDMP not reviewed this encounter.   Roosvelt Maser [place], [place] 07/31/20 [phone]

## 2020-07-31 NOTE — ED Triage Notes (Signed)
Pt reports headache,clogge dears and lightheadedness x 1 day.  Pt tried Debrox yesterday without relief.

## 2020-09-01 ENCOUNTER — Ambulatory Visit (HOSPITAL_COMMUNITY): Payer: Self-pay

## 2020-09-18 ENCOUNTER — Encounter (HOSPITAL_COMMUNITY): Payer: Self-pay | Admitting: Emergency Medicine

## 2020-09-18 ENCOUNTER — Inpatient Hospital Stay (HOSPITAL_COMMUNITY): Payer: Self-pay

## 2020-09-18 ENCOUNTER — Inpatient Hospital Stay (HOSPITAL_COMMUNITY)
Admission: EM | Admit: 2020-09-18 | Discharge: 2020-09-25 | DRG: 699 | Disposition: A | Discharge status: Home / Self Care | Payer: Self-pay | Attending: Internal Medicine | Admitting: Internal Medicine

## 2020-09-18 DIAGNOSIS — E8809 Other disorders of plasma-protein metabolism, not elsewhere classified: Secondary | ICD-10-CM | POA: Diagnosis present

## 2020-09-18 DIAGNOSIS — B9620 Unspecified Escherichia coli [E. coli] as the cause of diseases classified elsewhere: Secondary | ICD-10-CM | POA: Diagnosis present

## 2020-09-18 DIAGNOSIS — R197 Diarrhea, unspecified: Secondary | ICD-10-CM | POA: Diagnosis present

## 2020-09-18 DIAGNOSIS — E876 Hypokalemia: Secondary | ICD-10-CM | POA: Diagnosis present

## 2020-09-18 DIAGNOSIS — Z8249 Family history of ischemic heart disease and other diseases of the circulatory system: Secondary | ICD-10-CM

## 2020-09-18 DIAGNOSIS — N390 Urinary tract infection, site not specified: Secondary | ICD-10-CM | POA: Diagnosis present

## 2020-09-18 DIAGNOSIS — Z6828 Body mass index (BMI) 28.0-28.9, adult: Secondary | ICD-10-CM

## 2020-09-18 DIAGNOSIS — K5792 Diverticulitis of intestine, part unspecified, without perforation or abscess without bleeding: Secondary | ICD-10-CM | POA: Diagnosis present

## 2020-09-18 DIAGNOSIS — E874 Mixed disorder of acid-base balance: Secondary | ICD-10-CM | POA: Diagnosis not present

## 2020-09-18 DIAGNOSIS — Y830 Surgical operation with transplant of whole organ as the cause of abnormal reaction of the patient, or of later complication, without mention of misadventure at the time of the procedure: Secondary | ICD-10-CM | POA: Diagnosis present

## 2020-09-18 DIAGNOSIS — D84821 Immunodeficiency due to drugs: Secondary | ICD-10-CM | POA: Diagnosis present

## 2020-09-18 DIAGNOSIS — Z7952 Long term (current) use of systemic steroids: Secondary | ICD-10-CM

## 2020-09-18 DIAGNOSIS — K5732 Diverticulitis of large intestine without perforation or abscess without bleeding: Secondary | ICD-10-CM | POA: Diagnosis present

## 2020-09-18 DIAGNOSIS — I10 Essential (primary) hypertension: Secondary | ICD-10-CM | POA: Diagnosis present

## 2020-09-18 DIAGNOSIS — E860 Dehydration: Secondary | ICD-10-CM | POA: Diagnosis present

## 2020-09-18 DIAGNOSIS — Z833 Family history of diabetes mellitus: Secondary | ICD-10-CM

## 2020-09-18 DIAGNOSIS — N3001 Acute cystitis with hematuria: Secondary | ICD-10-CM

## 2020-09-18 DIAGNOSIS — Z79899 Other long term (current) drug therapy: Secondary | ICD-10-CM

## 2020-09-18 DIAGNOSIS — N179 Acute kidney failure, unspecified: Secondary | ICD-10-CM

## 2020-09-18 DIAGNOSIS — Z9049 Acquired absence of other specified parts of digestive tract: Secondary | ICD-10-CM

## 2020-09-18 DIAGNOSIS — Z9225 Personal history of immunosupression therapy: Secondary | ICD-10-CM

## 2020-09-18 DIAGNOSIS — N200 Calculus of kidney: Secondary | ICD-10-CM | POA: Diagnosis present

## 2020-09-18 DIAGNOSIS — T8619 Other complication of kidney transplant: Principal | ICD-10-CM | POA: Diagnosis present

## 2020-09-18 DIAGNOSIS — Z8719 Personal history of other diseases of the digestive system: Secondary | ICD-10-CM

## 2020-09-18 DIAGNOSIS — Z940 Kidney transplant status: Secondary | ICD-10-CM

## 2020-09-18 DIAGNOSIS — F17290 Nicotine dependence, other tobacco product, uncomplicated: Secondary | ICD-10-CM | POA: Diagnosis present

## 2020-09-18 DIAGNOSIS — E440 Moderate protein-calorie malnutrition: Secondary | ICD-10-CM | POA: Insufficient documentation

## 2020-09-18 DIAGNOSIS — Z20822 Contact with and (suspected) exposure to covid-19: Secondary | ICD-10-CM | POA: Diagnosis present

## 2020-09-18 DIAGNOSIS — D649 Anemia, unspecified: Secondary | ICD-10-CM | POA: Diagnosis present

## 2020-09-18 DIAGNOSIS — F1010 Alcohol abuse, uncomplicated: Secondary | ICD-10-CM | POA: Diagnosis present

## 2020-09-18 DIAGNOSIS — R634 Abnormal weight loss: Secondary | ICD-10-CM | POA: Diagnosis present

## 2020-09-18 LAB — URINALYSIS, ROUTINE W REFLEX MICROSCOPIC
Bilirubin Urine: NEGATIVE
Glucose, UA: NEGATIVE mg/dL
Ketones, ur: NEGATIVE mg/dL
Nitrite: POSITIVE — AB
Protein, ur: 100 mg/dL — AB
Specific Gravity, Urine: 1.012 (ref 1.005–1.030)
WBC, UA: >50 WBC/hpf — ABNORMAL HIGH (ref 0–5)
pH: 5 (ref 5.0–8.0)

## 2020-09-18 LAB — COMPREHENSIVE METABOLIC PANEL
ALT: 44 U/L (ref 0–44)
AST: 17 U/L (ref 15–41)
Albumin: 3.1 g/dL — ABNORMAL LOW (ref 3.5–5.0)
Alkaline Phosphatase: 81 U/L (ref 38–126)
Anion gap: 9 (ref 5–15)
BUN: 24 mg/dL — ABNORMAL HIGH (ref 6–20)
CO2: 13 mmol/L — ABNORMAL LOW (ref 22–32)
Calcium: 9.2 mg/dL (ref 8.9–10.3)
Chloride: 113 mmol/L — ABNORMAL HIGH (ref 98–111)
Creatinine, Ser: 2.43 mg/dL — ABNORMAL HIGH (ref 0.44–1.00)
GFR, Estimated: 23 mL/min — ABNORMAL LOW (ref >60)
Glucose, Bld: 146 mg/dL — ABNORMAL HIGH (ref 70–99)
Potassium: 4 mmol/L (ref 3.5–5.1)
Sodium: 135 mmol/L (ref 135–145)
Total Bilirubin: 0.5 mg/dL (ref 0.3–1.2)
Total Protein: 5.9 g/dL — ABNORMAL LOW (ref 6.5–8.1)

## 2020-09-18 LAB — CBC
HCT: 36.2 % (ref 36.0–46.0)
Hemoglobin: 12.1 g/dL (ref 12.0–15.0)
MCH: 31.2 pg (ref 26.0–34.0)
MCHC: 33.4 g/dL (ref 30.0–36.0)
MCV: 93.3 fL (ref 80.0–100.0)
Platelets: 289 10*3/uL (ref 150–400)
RBC: 3.88 MIL/uL (ref 3.87–5.11)
RDW: 12.4 % (ref 11.5–15.5)
WBC: 4.1 10*3/uL (ref 4.0–10.5)
nRBC: 0 % (ref 0.0–0.2)

## 2020-09-18 LAB — SARS CORONAVIRUS 2 (TAT 6-24 HRS): SARS Coronavirus 2: NEGATIVE

## 2020-09-18 LAB — LIPASE, BLOOD: Lipase: 31 U/L (ref 11–51)

## 2020-09-18 MED ORDER — OXYCODONE-ACETAMINOPHEN 5-325 MG PO TABS
1.0000 | ORAL_TABLET | Freq: Three times a day (TID) | ORAL | Status: DC | PRN
Start: 2020-09-18 — End: 2020-09-25
  Administered 2020-09-20 – 2020-09-24 (×6): 1 via ORAL
  Filled 2020-09-18 (×6): qty 1

## 2020-09-18 MED ORDER — MORPHINE SULFATE (PF) 2 MG/ML IV SOLN: 2.0000 mg | INTRAVENOUS | Status: DC | PRN

## 2020-09-18 MED ORDER — ONDANSETRON HCL 4 MG PO TABS
4.0000 mg | ORAL_TABLET | Freq: Four times a day (QID) | ORAL | Status: DC | PRN
  Administered 2020-09-19 (×2): 4 mg via ORAL
  Filled 2020-09-18 (×2): qty 1

## 2020-09-18 MED ORDER — NIFEDIPINE ER OSMOTIC RELEASE 60 MG PO TB24
60.0000 mg | ORAL_TABLET | Freq: Two times a day (BID) | ORAL | Status: DC
  Filled 2020-09-18: qty 1

## 2020-09-18 MED ORDER — ONDANSETRON HCL 4 MG/2ML IJ SOLN: 4.0000 mg | Freq: Four times a day (QID) | INTRAMUSCULAR | Status: DC | PRN

## 2020-09-18 MED ORDER — PREDNISONE 5 MG PO TABS
5.0000 mg | ORAL_TABLET | Freq: Every day | ORAL | Status: DC
  Administered 2020-09-19 – 2020-09-25 (×7): 5 mg via ORAL
  Filled 2020-09-18 (×8): qty 1

## 2020-09-18 MED ORDER — SODIUM CHLORIDE 0.9 % IV SOLN: INTRAVENOUS | Status: DC

## 2020-09-18 MED ORDER — TACROLIMUS 1 MG PO CAPS
4.0000 mg | ORAL_CAPSULE | Freq: Every day | ORAL | Status: DC
  Administered 2020-09-18 – 2020-09-24 (×7): 4 mg via ORAL
  Filled 2020-09-18 (×7): qty 4

## 2020-09-18 MED ORDER — TACROLIMUS 1 MG PO CAPS: 3.0000 mg | ORAL_CAPSULE | ORAL | Status: DC

## 2020-09-18 MED ORDER — FAMOTIDINE IN NACL 20-0.9 MG/50ML-% IV SOLN
20.0000 mg | Freq: Once | INTRAVENOUS | Status: AC
  Administered 2020-09-18: 20 mg via INTRAVENOUS
  Filled 2020-09-18: qty 50

## 2020-09-18 MED ORDER — SODIUM CHLORIDE 0.9 % IV BOLUS
1000.0000 mL | Freq: Once | INTRAVENOUS | Status: AC
Start: 2020-09-18 — End: 2020-09-18
  Administered 2020-09-18: 1000 mL via INTRAVENOUS

## 2020-09-18 MED ORDER — METOPROLOL TARTRATE 100 MG PO TABS
100.0000 mg | ORAL_TABLET | Freq: Two times a day (BID) | ORAL | Status: DC
  Administered 2020-09-18 – 2020-09-21 (×6): 100 mg via ORAL
  Filled 2020-09-18 (×7): qty 1

## 2020-09-18 MED ORDER — METRONIDAZOLE 500 MG/100ML IV SOLN
500.0000 mg | Freq: Three times a day (TID) | INTRAVENOUS | Status: DC
  Administered 2020-09-18 – 2020-09-23 (×15): 500 mg via INTRAVENOUS
  Filled 2020-09-18 (×15): qty 100

## 2020-09-18 MED ORDER — NIFEDIPINE ER OSMOTIC RELEASE 60 MG PO TB24
60.0000 mg | ORAL_TABLET | Freq: Two times a day (BID) | ORAL | Status: DC
  Administered 2020-09-19 – 2020-09-21 (×5): 60 mg via ORAL
  Filled 2020-09-18 (×6): qty 1

## 2020-09-18 MED ORDER — SODIUM CHLORIDE 0.9% FLUSH
3.0000 mL | Freq: Two times a day (BID) | INTRAVENOUS | Status: DC
  Administered 2020-09-18 – 2020-09-24 (×10): 3 mL via INTRAVENOUS

## 2020-09-18 MED ORDER — SODIUM CHLORIDE 0.9 % IV SOLN
2.0000 g | INTRAVENOUS | Status: DC
  Administered 2020-09-18 – 2020-09-22 (×4): 2 g via INTRAVENOUS
  Filled 2020-09-18 (×4): qty 20

## 2020-09-18 MED ORDER — FERROUS SULFATE 325 (65 FE) MG PO TABS
325.0000 mg | ORAL_TABLET | Freq: Every day | ORAL | Status: DC
  Administered 2020-09-19 – 2020-09-25 (×7): 325 mg via ORAL
  Filled 2020-09-18 (×7): qty 1

## 2020-09-18 MED ORDER — ACETAMINOPHEN 325 MG PO TABS
650.0000 mg | ORAL_TABLET | Freq: Four times a day (QID) | ORAL | Status: DC | PRN
  Administered 2020-09-18 – 2020-09-22 (×7): 650 mg via ORAL
  Filled 2020-09-18 (×7): qty 2

## 2020-09-18 MED ORDER — LACTATED RINGERS IV SOLN
INTRAVENOUS | Status: DC
  Administered 2020-09-19: 1000 mL via INTRAVENOUS

## 2020-09-18 MED ORDER — SODIUM CHLORIDE 0.9 % IV SOLN
2.0000 g | Freq: Once | INTRAVENOUS | Status: AC
  Administered 2020-09-18: 2 g via INTRAVENOUS
  Filled 2020-09-18: qty 20

## 2020-09-18 MED ORDER — MYCOPHENOLATE SODIUM 180 MG PO TBEC
360.0000 mg | DELAYED_RELEASE_TABLET | Freq: Two times a day (BID) | ORAL | Status: DC
  Administered 2020-09-18 – 2020-09-19 (×2): 360 mg via ORAL
  Filled 2020-09-18 (×4): qty 2

## 2020-09-18 MED ORDER — SACCHAROMYCES BOULARDII 250 MG PO CAPS
250.0000 mg | ORAL_CAPSULE | Freq: Two times a day (BID) | ORAL | Status: DC
  Administered 2020-09-18 – 2020-09-20 (×4): 250 mg via ORAL
  Filled 2020-09-18 (×5): qty 1

## 2020-09-18 MED ORDER — TACROLIMUS 1 MG PO CAPS
3.0000 mg | ORAL_CAPSULE | Freq: Every morning | ORAL | Status: DC
  Administered 2020-09-19 – 2020-09-25 (×7): 3 mg via ORAL
  Filled 2020-09-18 (×8): qty 3

## 2020-09-18 MED ORDER — FAMOTIDINE 20 MG PO TABS
20.0000 mg | ORAL_TABLET | Freq: Every day | ORAL | Status: DC
  Administered 2020-09-19 – 2020-09-25 (×7): 20 mg via ORAL
  Filled 2020-09-18 (×7): qty 1

## 2020-09-18 MED ORDER — SIMVASTATIN 20 MG PO TABS
20.0000 mg | ORAL_TABLET | Freq: Every day | ORAL | Status: DC
  Administered 2020-09-18 – 2020-09-24 (×7): 20 mg via ORAL
  Filled 2020-09-18 (×7): qty 1

## 2020-09-18 MED ORDER — ALBUTEROL SULFATE (2.5 MG/3ML) 0.083% IN NEBU: 2.5000 mg | INHALATION_SOLUTION | Freq: Four times a day (QID) | RESPIRATORY_TRACT | Status: DC | PRN

## 2020-09-18 MED ORDER — HEPARIN SODIUM (PORCINE) 5000 UNIT/ML IJ SOLN
5000.0000 [IU] | Freq: Three times a day (TID) | INTRAMUSCULAR | Status: DC
  Filled 2020-09-18 (×9): qty 1

## 2020-09-18 MED ORDER — ACETAMINOPHEN 650 MG RE SUPP: 650.0000 mg | Freq: Four times a day (QID) | RECTAL | Status: DC | PRN

## 2020-09-18 MED ORDER — SODIUM CHLORIDE 0.9 % IV SOLN: Freq: Once | INTRAVENOUS | Status: AC

## 2020-09-18 NOTE — ED Notes (Signed)
RN ambulated pt to bathroom.  

## 2020-09-18 NOTE — ED Notes (Signed)
Patient transported to CT 

## 2020-09-18 NOTE — ED Notes (Signed)
RN attempted report x1.  

## 2020-09-18 NOTE — ED Provider Notes (Signed)
Emergency Medicine Provider Triage Evaluation Note  Melanie [name] , a 55 y.o. female  was evaluated in triage.  Pt complains of several week history of abdominal pain, nausea, and diarrhea.  Patient is s/p renal transplant 2013.  History of alcohol use disorder.    She states that for the past few weeks she has been having progressively worsening diarrhea symptoms.  She has 6 episodes of nonbloody diarrhea per day.  She has been taking Imodium 4 times daily without any improvement.  She states that she has left lower quadrant abdominal pain, worse when she has urge to defecate.  She then has relief/improvement of her left lower quadrant abdominal pain with defecation.  She has nausea, but only one episode of nonbloody emesis in the past 2 weeks.  T-max 100.2 F at home.  Her primary complaint is generalized weakness and malaise.   Review of Systems  Positive: Left lower quadrant abdominal pain, nausea, weakness, diarrhea. Negative: Cough, CP  Physical Exam  BP 114/80   Pulse 73   Temp 98.6 F (37 C) (Oral)   Resp 16   LMP 05/26/2020   SpO2 100%  Gen:   Awake, no distress   Resp:  Normal effort  MSK:   Moves extremities without difficulty  Other:    Medical Decision Making  Medically screening exam initiated at 10:30 AM.  Appropriate orders placed.  Melanie [name] was informed that the remainder of the evaluation will be completed by another provider, this initial triage assessment does not replace that evaluation, and the importance of remaining in the ED until their evaluation is complete.  Concern for electrolyte derangement or renal impairment from diarrhea   Melanie [name] 09/18/20 1030    Linwood Dibbles, MD 09/19/20 [phone]

## 2020-09-18 NOTE — ED Provider Notes (Signed)
[facility] EMERGENCY DEPARTMENT Provider Note   CSN: [id number] Arrival date & time: 09/18/20  0945     History Chief Complaint  Patient presents with   Abdominal Pain    Melanie [name] is a 55 y.o. female with past medical history significant for HTN, alcohol use disorder, and renal disorder s/p renal transplant in 2013 who presents to the ED with complaints of diarrhea with associated abdominal pain and nausea symptoms.  She reports that she is having 6 episodes of nonbloody diarrhea on a daily basis.  Patient reports that she has been taking Imodium 4 times daily without any improvement.  This is acute on chronic from when she was previously seen in the ED several months ago and had been diagnosed with possible pancreatitis.  She states that she has left lower quadrant abdominal discomfort when she has an urge to defecate.  When she passes stool, her pain symptoms are improved.  She denies any hematochezia or melena.  She has only had 1 episode of nonbloody emesis in the past couple of weeks.  T-max 100.2 F at home.  She states that given her significant diarrhea and nausea symptoms, she is feeling entirely drained and weak.  Her weakness/malaise is her primary concern.  She tells me that she has a "tickle" when she urinates, but no increased urinary frequency or significant discomfort.  No flank pain.  She denies any obvious sick contacts, chest pain difficulty breathing, cough, hematemesis, or any other symptoms.  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   Neuromuscular disorder [facility])    Renal disorder    Sleep apnea     Patient Active Problem List   Diagnosis Date Noted   AKI (acute kidney injury) (HCC) 09/18/2020   Visit for routine gyn exam 06/29/2019   Screening mammogram, encounter for 06/29/2019   Menorrhagia 06/29/2019     Past Surgical History:  Procedure Laterality Date   DILATION AND CURETTAGE OF UTERUS     KIDNEY TRANSPLANT  2013   NEPHRECTOMY TRANSPLANTED ORGAN       OB History     Gravida  2   Para  1   Term  1   Preterm      AB  1   Living  1      SAB      IAB      Ectopic      Multiple      Live Births  1           Family History  Problem Relation Age of Onset   Diabetes Father    Hypertension Father    Alcohol abuse Maternal Grandmother    Alcohol abuse Maternal Grandfather    Alcohol abuse Paternal Grandmother    Diabetes Paternal Grandmother    Alcohol abuse Paternal Grandfather     Social History   Tobacco Use   Smoking status: Light Smoker    Pack years: 0.00    Types: Cigars   Smokeless tobacco: Never   Tobacco comments:    3 per day  Vaping Use   Vaping Use: Never used  Substance Use Topics   Alcohol use: Yes    Comment: 3 per day   Drug use: Yes    Types: Marijuana    Comment: 4  times per week    Home Medications Prior to Admission medications   Medication Sig Start Date End Date Taking? Authorizing Provider  famotidine (PEPCID) 20 MG tablet Take 20 mg by mouth as needed for heartburn or indigestion.    [provider]  ferrous sulfate 325 (65 FE) MG tablet Take 325 mg by mouth daily with breakfast.    [provider]  megestrol (MEGACE) 40 MG tablet Take 0.5 tablets (20 mg total) by mouth daily. Can increase to two tablets twice a day in the event of heavy bleeding 07/02/19   Hermina Staggers, MD  metoprolol tartrate (LOPRESSOR) 100 MG tablet Take 100 mg by mouth 2 (two) times daily.    [provider]  mycophenolate (MYFORTIC) 360 MG TBEC EC tablet Take 360 mg by mouth 2 (two) times daily.    [provider]  NIFEdipine (ADALAT CC) 60 MG 24 hr tablet Take 60 mg by mouth 2 (two) times daily.    [provider]  ondansetron (ZOFRAN ODT) 4 MG disintegrating tablet Take 1 tablet (4 mg total) by  mouth every 8 (eight) hours as needed for nausea or vomiting. 06/11/20   Khatri, Hina, PA-C  oxyCODONE-acetaminophen (PERCOCET/ROXICET) 5-325 MG tablet Take 1 tablet by mouth every 8 (eight) hours as needed for severe pain. 06/11/20   Khatri, Hina, PA-C  potassium chloride SA (K-DUR) 20 MEQ tablet Take 20 mEq by mouth 2 (two) times daily.    [provider]  predniSONE (DELTASONE) 5 MG tablet Take 5 mg by mouth daily with breakfast.    [provider]  simvastatin (ZOCOR) 20 MG tablet Take 20 mg by mouth at bedtime.    [provider]  tacrolimus (PROGRAF) 1 MG capsule Take 3-4 mg by mouth See admin instructions. Take 3mg  in the morning and 4mg  at night.    [provider]    Allergies    Patient has no known allergies.  Review of Systems   Review of Systems  All other systems reviewed and are negative.  Physical Exam Updated Vital Signs BP 126/83   Pulse 71   Temp 98.6 F (37 C) (Oral)   Resp 16   LMP 05/26/2020   SpO2 100%   Physical Exam Vitals and nursing note reviewed. Exam conducted with a chaperone present.  Constitutional:      General: She is not in acute distress.    Appearance: She is not toxic-appearing.  HENT:     Head: Normocephalic and atraumatic.  Eyes:     General: No scleral icterus.    Conjunctiva/sclera: Conjunctivae normal.  Cardiovascular:     Rate and Rhythm: Normal rate.     Pulses: Normal pulses.  Pulmonary:     Effort: Pulmonary effort is normal. No respiratory distress.  Abdominal:     General: Abdomen is flat. There is no distension.     Palpations: Abdomen is soft.     Tenderness: There is abdominal tenderness. There is no guarding.     Comments: Mild tenderness in LLQ.  None over bladder.  No guarding.  Soft, nondistended.  Musculoskeletal:     Right lower leg: No edema.     Left lower leg: No edema.  Skin:    General: Skin is dry.  Neurological:     Mental Status: She is alert.     GCS: GCS eye  subscore is 4. GCS verbal subscore is 5. GCS motor subscore is 6.  Psychiatric:  Mood and Affect: Mood normal.        Behavior: Behavior normal.        Thought Content: Thought content normal.    ED Results / Procedures / Treatments   Labs (all labs ordered are listed, but only abnormal results are displayed) Labs Reviewed  COMPREHENSIVE METABOLIC PANEL - Abnormal; Notable for the following components:      Result Value   Chloride 113 (*)    CO2 13 (*)    Glucose, Bld 146 (*)    BUN 24 (*)    Creatinine, Ser 2.43 (*)    Total Protein 5.9 (*)    Albumin 3.1 (*)    GFR, Estimated 23 (*)    All other components within normal limits  URINALYSIS, ROUTINE W REFLEX MICROSCOPIC - Abnormal; Notable for the following components:   APPearance TURBID (*)    Hgb urine dipstick SMALL (*)    Protein, ur 100 (*)    Nitrite POSITIVE (*)    Leukocytes,Ua LARGE (*)    WBC, UA >50 (*)    Bacteria, UA MANY (*)    All other components within normal limits  URINE CULTURE  GASTROINTESTINAL PANEL BY PCR, STOOL (REPLACES STOOL CULTURE)  C DIFFICILE QUICK SCREEN W PCR REFLEX    SARS CORONAVIRUS 2 (TAT 6-24 HRS)  LIPASE, BLOOD  CBC    EKG None  Radiology No results found.  Procedures Procedures   Medications Ordered in ED Medications  sodium chloride 0.9 % bolus 1,000 mL (1,000 mLs Intravenous New Bag/Given 09/18/20 1341)  cefTRIAXone (ROCEPHIN) 2 g in sodium chloride 0.9 % 100 mL IVPB (2 g Intravenous New Bag/Given 09/18/20 1341)    ED Course  I have reviewed the triage vital signs and the nursing notes.  Pertinent labs & imaging results that were available during my care of the patient were reviewed by me and considered in my medical decision making (see chart for details).    MDM Rules/Calculators/A&P                          Melanie [name] was evaluated in Emergency Department on 09/18/2020 for the symptoms described in the history of present illness. She was evaluated in the  context of the global COVID-19 pandemic, which necessitated consideration that the patient might be at risk for infection with the SARS-CoV-2 virus that causes COVID-19. Institutional protocols and algorithms that pertain to the evaluation of patients at risk for COVID-19 are in a state of rapid change based on information released by regulatory bodies including the CDC and federal and state organizations. These policies and algorithms were followed during the patient's care in the ED.  I personally reviewed patient's medical chart and all notes from triage and staff during today's encounter. I have also ordered and reviewed all labs and imaging that I felt to be medically necessary in the evaluation of this patient's complaints and with consideration of their physical exam. If needed, translation services were available and utilized.   Based on patient's initial examination, concern was for electrolyte derangement and possible kidney injury given her frequent episodes of loose stools and generalized malaise.  UA notable for infection.  Nitrite positive, large leukocytes greater than 50, and many bacteria.  She states that she has had UTIs in the past, most recently treated with Keflex in July 2021 for E. coli UTI.  Will initiate treatment with Rocephin IV here in the ED.  Her CMP is  notable most for acute kidney injury.  She has a normal serum creatinine and GFR at baseline.  Today her creatinine is elevated at 2.43, GFR reduced to 23.  Suspect prerenal in setting of diarrhea.  CBC without leukocytosis concerning for infection.  No significant electrolyte derangement.  Will treat with fluids.  Will consult hospitalist for admission for her AKI and UTI and setting of weakness and diarrhea.  I discussed case with Dr. Katrinka Blazing, hospitalist, who will see and admit.  Recommending CT of abdomen and pelvis, I agree. Will order now.  Final Clinical Impression(s) / ED Diagnoses Final diagnoses:  AKI (acute  kidney injury) (HCC)  Acute cystitis with hematuria    Rx / DC Orders ED Discharge Orders     None        Lorelee New, PA-C 09/18/20 1418    Linwood Dibbles, MD 09/19/20 [phone]

## 2020-09-18 NOTE — H&P (Signed)
History and Physical    Ajiah Mcglinn RSW:[id number] DOB: 05-28-1965 DOA: 09/18/2020  Referring MD/NP/PA: Evelena Leyden, PA-C PCP: Patient, No Pcp Per (Inactive)  Patient coming from: Home  Chief Complaint: Diarrhea and abdominal pain.  I have personally briefly reviewed patient's old medical records in [facility]   HPI: Melanie [name] is a 55 y.o. female with medical history significant of s/p right kidney transplant on chronic immunosuppressive therapy since 2013 who presents with complaints of diarrhea and abdominal pain.  Patient reports symptoms started after she was hospitalized in 05/2020 with pancreatitis.  She had followed up with Dr. Elnoria Howard of GI in outpatient setting and had MRI which confirmed uncomplicated pancreatitis.  Prior to coming into the hospital that time she was having approximately 3 bowel movements per day.  However, since that time it progressively worsened to the point where she was having 6-7 nonbloody bowel movements per day.  Initially Imodium would help, but at this point she is taking up to 4 on a daily basis without any change in symptoms.  Prior to having to use the restroom patient reports having a sharp pain in left lower quadrant of her abdomen, feels bubbling in her stomach, and then has to run to the bathroom immediately.  Notes associated symptoms of nausea, poor appetite, generalized weakness, weight loss of approximately 16 pounds since March, lightheadedness, "tingling" /discomfort at the end of urination, and may have vomited once several weeks ago.  Denied having any recent sick contacts, chest pain, focal weakness, has not been on antibiotics since being treated for urinary tract infection in July 2021.  Patient reports that she quit drinking alcohol as well as smoking since her last hospitalization.  She tried to drink some alcohol yesterday, but reported that it burned and he was not able to drink it.  She last had a colonoscopy prior to having the renal  transplant at the age of 89, but has not had a colonoscopy since that time.  ED Course: Upon admission to the emergency department patient was seen to be afebrile, pulse 46-75, and all other vital signs maintained.  Labs significant for CBC within normal limits, BUN 24, creatinine 2.43, lipase 31, and albumin 3.1.  Urinalysis was positive for large leukocytes, positive nitrates, many bacteria, 11-20 squamous epithelial cells, and greater than 50 WBCs.  Patient had been given Rocephin IV.  Review of Systems  Constitutional:  Positive for weight loss. Negative for fever.  HENT:  Negative for congestion and nosebleeds.   Eyes:  Negative for pain.  Respiratory:  Negative for cough and shortness of breath.   Cardiovascular:  Negative for chest pain and leg swelling.  Gastrointestinal:  Positive for abdominal pain and nausea. Negative for vomiting.  Genitourinary:  Positive for dysuria. Negative for hematuria.  Musculoskeletal:  Negative for falls.  Skin:  Negative for rash.  Neurological:  Positive for dizziness and weakness. Negative for loss of consciousness.  Psychiatric/Behavioral:  Negative for memory loss and substance abuse.    Past Medical History:  Diagnosis Date   Anxiety    Blood transfusion without reported diagnosis    Dyspnea    Headache    Kidney transplanted 2013   right   Neuromuscular disorder [facility])    Renal disorder    Sleep apnea     Past Surgical History:  Procedure Laterality Date   DILATION AND CURETTAGE OF UTERUS     KIDNEY TRANSPLANT  2013   NEPHRECTOMY TRANSPLANTED ORGAN  reports that she has been smoking cigars. She has never used smokeless tobacco. She reports current alcohol use. She reports current drug use. Drug: Marijuana.  No Known Allergies  Family History  Problem Relation Age of Onset   Diabetes Father    Hypertension Father    Alcohol abuse Maternal Grandmother    Alcohol abuse Maternal Grandfather    Alcohol abuse Paternal  Grandmother    Diabetes Paternal Grandmother    Alcohol abuse Paternal Grandfather     Prior to Admission medications   Medication Sig Start Date End Date Taking? Authorizing Provider  famotidine (PEPCID) 20 MG tablet Take 20 mg by mouth as needed for heartburn or indigestion.    [provider]  ferrous sulfate 325 (65 FE) MG tablet Take 325 mg by mouth daily with breakfast.    [provider]  megestrol (MEGACE) 40 MG tablet Take 0.5 tablets (20 mg total) by mouth daily. Can increase to two tablets twice a day in the event of heavy bleeding 07/02/19   Hermina StaggersErvin, Michael L, MD  metoprolol tartrate (LOPRESSOR) 100 MG tablet Take 100 mg by mouth 2 (two) times daily.    [provider]  mycophenolate (MYFORTIC) 360 MG TBEC EC tablet Take 360 mg by mouth 2 (two) times daily.    [provider]  NIFEdipine (ADALAT CC) 60 MG 24 hr tablet Take 60 mg by mouth 2 (two) times daily.    [provider]  ondansetron (ZOFRAN ODT) 4 MG disintegrating tablet Take 1 tablet (4 mg total) by mouth every 8 (eight) hours as needed for nausea or vomiting. 06/11/20   Khatri, Hina, PA-C  oxyCODONE-acetaminophen (PERCOCET/ROXICET) 5-325 MG tablet Take 1 tablet by mouth every 8 (eight) hours as needed for severe pain. 06/11/20   Khatri, Hina, PA-C  potassium chloride SA (K-DUR) 20 MEQ tablet Take 20 mEq by mouth 2 (two) times daily.    [provider]  predniSONE (DELTASONE) 5 MG tablet Take 5 mg by mouth daily with breakfast.    [provider]  simvastatin (ZOCOR) 20 MG tablet Take 20 mg by mouth at bedtime.    [provider]  tacrolimus (PROGRAF) 1 MG capsule Take 3-4 mg by mouth See admin instructions. Take 3mg  in the morning and 4mg  at night.    [provider]    Physical Exam:  Constitutional: Middle-aged female who appeared ill. Vitals:   09/18/20 0951 09/18/20 1015 09/18/20 1300 09/18/20 1330  BP: 114/80  131/86 126/83  Pulse: (!)  46 73 71 71  Resp: 16  16   Temp: 98.6 F (37 C)     TempSrc: Oral     SpO2: 100%  100% 100%   Eyes: PERRL, lids and conjunctivae normal ENMT: Mucous membranes are dry.  Posterior pharynx clear of any exudate or lesions.  Neck: normal, supple, no masses, no thyromegaly Respiratory: clear to auscultation bilaterally, no wheezing, no crackles. Normal respiratory effort. No accessory muscle use.  Cardiovascular: Regular rate and rhythm, no murmurs / rubs / gallops. No extremity edema. 2+ pedal pulses. No carotid bruits.  Abdomen: Tenderness to palpation of left lower quadrant of her abdomen.  Bowel sounds are positive in all 4 quadrants. Musculoskeletal: no clubbing / cyanosis. No joint deformity upper and lower extremities. Good ROM, no contractures. Normal muscle tone.  Skin: Skin turgor.  No rashes, lesions, ulcers. No induration Neurologic: CN 2-12 grossly intact. Sensation intact, DTR normal. Strength 5/5 in all 4.  Psychiatric: Normal judgment  and insight. Alert and oriented x 3. Normal mood.     Labs on Admission: I have personally reviewed following labs and imaging studies  CBC: Recent Labs  Lab 09/18/20 1014  WBC 4.1  HGB 12.1  HCT 36.2  MCV 93.3  PLT 289   Basic Metabolic Panel: Recent Labs  Lab 09/18/20 1014  NA 135  K 4.0  CL 113*  CO2 13*  GLUCOSE 146*  BUN 24*  CREATININE 2.43*  CALCIUM 9.2   GFR: CrCl cannot be calculated (Unknown ideal weight.). Liver Function Tests: Recent Labs  Lab 09/18/20 1014  AST 17  ALT 44  ALKPHOS 81  BILITOT 0.5  PROT 5.9*  ALBUMIN 3.1*   Recent Labs  Lab 09/18/20 1014  LIPASE 31   No results for input(s): AMMONIA in the last 168 hours. Coagulation Profile: No results for input(s): INR, PROTIME in the last 168 hours. Cardiac Enzymes: No results for input(s): CKTOTAL, CKMB, CKMBINDEX, TROPONINI in the last 168 hours. BNP (last 3 results) No results for input(s): PROBNP in the last 8760 hours. HbA1C: No  results for input(s): HGBA1C in the last 72 hours. CBG: No results for input(s): GLUCAP in the last 168 hours. Lipid Profile: No results for input(s): CHOL, HDL, LDLCALC, TRIG, CHOLHDL, LDLDIRECT in the last 72 hours. Thyroid Function Tests: No results for input(s): TSH, T4TOTAL, FREET4, T3FREE, THYROIDAB in the last 72 hours. Anemia Panel: No results for input(s): VITAMINB12, FOLATE, FERRITIN, TIBC, IRON, RETICCTPCT in the last 72 hours. Urine analysis:    Component Value Date/Time   COLORURINE YELLOW 09/18/2020 1014   APPEARANCEUR TURBID (A) 09/18/2020 1014   LABSPEC 1.012 09/18/2020 1014   PHURINE 5.0 09/18/2020 1014   GLUCOSEU NEGATIVE 09/18/2020 1014   HGBUR SMALL (A) 09/18/2020 1014   BILIRUBINUR NEGATIVE 09/18/2020 1014   KETONESUR NEGATIVE 09/18/2020 1014   PROTEINUR 100 (A) 09/18/2020 1014   NITRITE POSITIVE (A) 09/18/2020 1014   LEUKOCYTESUR LARGE (A) 09/18/2020 1014   Sepsis Labs: No results found for this or any previous visit (from the past 240 hour(s)).   Radiological Exams on Admission: CT ABDOMEN PELVIS WO CONTRAST  Result Date: 09/18/2020 CLINICAL DATA:  Abdominal pain, concern for diverticulitis. EXAM: CT ABDOMEN AND PELVIS WITHOUT CONTRAST TECHNIQUE: Multidetector CT imaging of the abdomen and pelvis was performed following the standard protocol without IV contrast. COMPARISON:  MR abdomen dated 07/13/2020 and CT abdomen pelvis dated 06/11/2020. FINDINGS: Lower chest: No acute abnormality. Hepatobiliary: A small cyst is seen in the right hepatic lobe, measuring 4 mm. Status post cholecystectomy. No biliary dilatation. Pancreas: Unremarkable. No pancreatic ductal dilatation or surrounding inflammatory changes. Spleen: Normal in size without focal abnormality. Adrenals/Urinary Tract: Adrenal glands are unremarkable. The native kidneys are atrophic with a few small cysts. No hydronephrosis. A right lower quadrant transplant kidney is noted with a nonobstructive 6 mm  calculus in the lower pole. No hydronephrosis or focal lesion. Bladder is unremarkable. Stomach/Bowel: Stomach is within normal limits. Appendix appears normal. Diverticula are seen in the ascending colon with mild associated fat stranding, most consistent with diverticulitis. There are also diverticula and minimal associated fat stranding involving the descending colon (series 3, images 47-52). No evidence of abscess or fistula formation in either location. No evidence of bowel obstruction. Vascular/Lymphatic: Aortic atherosclerosis. No enlarged abdominal or pelvic lymph nodes. Reproductive: Multiple uterine fibroids are noted. Other: Focal diastasis in the anterior abdominal wall is unchanged. No abdominopelvic ascites. Musculoskeletal: No acute or significant osseous findings. IMPRESSION: Findings most  consistent with diverticulitis involving the ascending colon and to a lesser extent the descending colon. No evidence of abscess or fistula formation. Electronically Signed   By: Romona Curls M.D.   On: 09/18/2020 16:01    CT scan of the abdomen pelvis: Independently reviewed.  Showing inflammation of the ascending colon.  Assessment/Plan  Diarrhea 2/2 Diverticulitis: Acute.  Patient presents with complaints of several weeks of progressively worsening diarrhea and left lower quadrant abdominal pain despite using Imodium.  Denied any recent use of antibiotics.  CT scan of the abdomen pelvis was significant for diverticulitis involving the descending colon and lesser extent the descending colon with no signs of abscess or fistula. -Admit to MedSurg bed -Strict intake and output -Follow-up C. difficile and GI panel -Clear liquid diet and consider advancing as tolerated in a.m. -Continue Rocephin and metronidazole -Florastor twice daily -Morphine IV as needed for severe pain -Discussed with patient she will need to follow-up or needs to have a follow-up appointment scheduled with GI for colonoscopy after  resolution of diverticulitis  Urinary tract infection: Acute.  Patient complains of having a tingle discomfort at the end of urination.  Urinalysis was positive for large leukocytes, positive nitrates, many bacteria, greater than 50 WBCs, and 11-20 squamous epithelial cells. -Follow-up urine cultures -Continue empiric antibiotics of Rocephin  Acute kidney injury s/p renal transplant on chronic immunosuppressive therapy: Patient creatinine elevated up to 2.43 with BUN 24.  Baseline creatinine previously had been noted to be around 1 in 05/2020.  She is status post renal transplant since 2013.  Suspect prerenal cause given diarrhea, nausea, and vomiting. -Normal saline IV fluids at 100 mL/h -Discussed case with nephrology who recommended checking a tacrolimus level now, and in a.m. -Check CMV panel -Nephrology agreed with continuation of prednisone, tacrolimus, and Myfortic for the time being.  Acknowledged the possibility of tacrolimus and Myfortic possibly being the cause of patient's symptoms.  Nephrology we will formally see in a.m -Continue to monitor kidney function daily  Essential hypertension: On admission blood pressures noted to be 114/82 131/86.  home blood pressure medications metoprolol 100 mg twice daily and nifedipine 60 mg twice daily. -Continue home blood pressure medications as tolerated.  Weight loss: Patient reports losing approximately 16 pounds since March of this year. -Add-on TSH  History of hypokalemia: Patient's potassium levels currently within normal limits.  At baseline she appears to be on potassium supplementation twice daily -Potassium supplementation currently held  Hypoalbuminemia: Acute.  Albumin noted to be low at 3.1. -Check prealbumin in a.m.  DVT prophylaxis: Heparin Code Status: Full Family Communication: Patient's wife updated at bedside Disposition Plan: Likely discharge home in 3 days Consults called: Nephrology Admission status: Inpatient,  require more than 2 midnight stay  Clydie Braun MD [facility]   If 7PM-7AM, please contact night-coverage   09/18/2020, 2:13 PM

## 2020-09-18 NOTE — ED Triage Notes (Signed)
Patient complains of increasing abdominal pain, nausea, and diarrhea for the last few weeks, reports history of pancreatitis. Patient alert, oriented, and in no apparent distress at this time.

## 2020-09-18 NOTE — Progress Notes (Signed)
Informed of patiebt. Presented with months of abd pain, diarrhea. Follows with Dr. Hyman Hopes, last seen in Dec 2021. DDKT 2013 [place], [place]). Has had good graft function with a baseline Cr around 0.9-1, now with Cr 2.4 which could be related to pre-renal injury. Given her diarrhea, recommend checking CMV panel, stat tacrolimus trough and again in the AM (12 hours after her PM dose and 30 min before AM tacrolimus dose). In the interim, recommend resuming her immunosuppression and would continue with fluids--recommend LR. Given UA findings, would recommend checking a urine culture and empiric treatment (will defer to primary service). Case/plan discussed with Dr. Katrinka Blazing. Full consult to follow in AM.  Anthony Sar, MD [facility]

## 2020-09-18 NOTE — ED Notes (Signed)
Patient has not yet been roomed into ED room 2

## 2020-09-18 NOTE — Plan of Care (Signed)
  Problem: Education: Goal: Knowledge of General Education information will improve Description: Including pain rating scale, medication(s)/side effects and non-pharmacologic comfort measures Outcome: Progressing   Problem: Health Behavior/Discharge Planning: Goal: Ability to manage health-related needs will improve Outcome: Progressing   Problem: Clinical Measurements: Goal: Ability to maintain clinical measurements within normal limits will improve Outcome: Progressing Goal: Will remain free from infection Outcome: Progressing Goal: Diagnostic test results will improve Outcome: Progressing Goal: Respiratory complications will improve Outcome: Progressing Goal: Cardiovascular complication will be avoided Outcome: Progressing   Problem: Activity: Goal: Risk for activity intolerance will decrease Outcome: Progressing   Problem: Nutrition: Goal: Adequate nutrition will be maintained Outcome: Progressing   Problem: Safety: Goal: Ability to remain free from injury will improve Outcome: Progressing   

## 2020-09-18 NOTE — ED Notes (Signed)
Patient roomed to ED2.

## 2020-09-19 ENCOUNTER — Other Ambulatory Visit: Payer: Self-pay

## 2020-09-19 LAB — CBC
HCT: 31.2 % — ABNORMAL LOW (ref 36.0–46.0)
Hemoglobin: 10.4 g/dL — ABNORMAL LOW (ref 12.0–15.0)
MCH: 31.1 pg (ref 26.0–34.0)
MCHC: 33.3 g/dL (ref 30.0–36.0)
MCV: 93.4 fL (ref 80.0–100.0)
Platelets: 223 10*3/uL (ref 150–400)
RBC: 3.34 MIL/uL — ABNORMAL LOW (ref 3.87–5.11)
RDW: 12.2 % (ref 11.5–15.5)
WBC: 4.1 10*3/uL (ref 4.0–10.5)
nRBC: 0 % (ref 0.0–0.2)

## 2020-09-19 LAB — RENAL FUNCTION PANEL
Albumin: 2.5 g/dL — ABNORMAL LOW (ref 3.5–5.0)
Anion gap: 6 (ref 5–15)
BUN: 18 mg/dL (ref 6–20)
CO2: 12 mmol/L — ABNORMAL LOW (ref 22–32)
Calcium: 8.5 mg/dL — ABNORMAL LOW (ref 8.9–10.3)
Chloride: 119 mmol/L — ABNORMAL HIGH (ref 98–111)
Creatinine, Ser: 1.63 mg/dL — ABNORMAL HIGH (ref 0.44–1.00)
GFR, Estimated: 37 mL/min — ABNORMAL LOW (ref >60)
Glucose, Bld: 97 mg/dL (ref 70–99)
Phosphorus: 2.1 mg/dL — ABNORMAL LOW (ref 2.5–4.6)
Potassium: 3.6 mmol/L (ref 3.5–5.1)
Sodium: 137 mmol/L (ref 135–145)

## 2020-09-19 LAB — BASIC METABOLIC PANEL
Anion gap: 7 (ref 5–15)
BUN: 19 mg/dL (ref 6–20)
CO2: 14 mmol/L — ABNORMAL LOW (ref 22–32)
Calcium: 8.5 mg/dL — ABNORMAL LOW (ref 8.9–10.3)
Chloride: 117 mmol/L — ABNORMAL HIGH (ref 98–111)
Creatinine, Ser: 1.63 mg/dL — ABNORMAL HIGH (ref 0.44–1.00)
GFR, Estimated: 37 mL/min — ABNORMAL LOW (ref >60)
Glucose, Bld: 79 mg/dL (ref 70–99)
Potassium: 3 mmol/L — ABNORMAL LOW (ref 3.5–5.1)
Sodium: 138 mmol/L (ref 135–145)

## 2020-09-19 LAB — TSH: TSH: 1.164 u[IU]/mL (ref 0.350–4.500)

## 2020-09-19 LAB — GLUCOSE, CAPILLARY
Glucose-Capillary: 100 mg/dL — ABNORMAL HIGH (ref 70–99)
Glucose-Capillary: 126 mg/dL — ABNORMAL HIGH (ref 70–99)

## 2020-09-19 LAB — HIV ANTIBODY (ROUTINE TESTING W REFLEX): HIV Screen 4th Generation wRfx: NONREACTIVE

## 2020-09-19 MED ORDER — SODIUM CHLORIDE 0.45 % IV SOLN: INTRAVENOUS | Status: DC

## 2020-09-19 MED ORDER — STERILE WATER FOR INJECTION IV SOLN
INTRAVENOUS | Status: DC
  Filled 2020-09-19 (×7): qty 1000

## 2020-09-19 MED ORDER — POTASSIUM CHLORIDE CRYS ER 20 MEQ PO TBCR
40.0000 meq | EXTENDED_RELEASE_TABLET | Freq: Every day | ORAL | Status: AC
  Administered 2020-09-19 – 2020-09-20 (×2): 40 meq via ORAL
  Filled 2020-09-19 (×2): qty 2

## 2020-09-19 MED ORDER — ADULT MULTIVITAMIN W/MINERALS CH
1.0000 | ORAL_TABLET | Freq: Every day | ORAL | Status: DC
  Administered 2020-09-19 – 2020-09-25 (×7): 1 via ORAL
  Filled 2020-09-19 (×7): qty 1

## 2020-09-19 MED ORDER — ENSURE ENLIVE PO LIQD
237.0000 mL | Freq: Three times a day (TID) | ORAL | Status: DC
  Administered 2020-09-19: 237 mL via ORAL

## 2020-09-19 MED ORDER — POTASSIUM CHLORIDE 10 MEQ/100ML IV SOLN
10.0000 meq | INTRAVENOUS | Status: AC
  Administered 2020-09-19 (×2): 10 meq via INTRAVENOUS
  Filled 2020-09-19 (×2): qty 100

## 2020-09-19 MED ORDER — SODIUM CHLORIDE 0.9 % IV SOLN: INTRAVENOUS | Status: DC

## 2020-09-19 NOTE — Plan of Care (Signed)
  Problem: Education: Goal: Knowledge of General Education information will improve Description Including pain rating scale, medication(s)/side effects and non-pharmacologic comfort measures Outcome: Progressing   

## 2020-09-19 NOTE — Progress Notes (Signed)
IVT consulted for difficult stick.  Flowsheet shows PIV still in place.  Per RN, Tamera Punt, site was leaking and no longer in place.  Miranda to document.

## 2020-09-19 NOTE — Plan of Care (Signed)
  Problem: Education: Goal: Knowledge of General Education information will improve Description: Including pain rating scale, medication(s)/side effects and non-pharmacologic comfort measures Outcome: Progressing   Problem: Health Behavior/Discharge Planning: Goal: Ability to manage health-related needs will improve Outcome: Progressing   Problem: Clinical Measurements: Goal: Ability to maintain clinical measurements within normal limits will improve Outcome: Progressing Goal: Will remain free from infection Outcome: Progressing Goal: Diagnostic test results will improve Outcome: Progressing Goal: Respiratory complications will improve Outcome: Progressing Goal: Cardiovascular complication will be avoided Outcome: Progressing   Problem: Activity: Goal: Risk for activity intolerance will decrease Outcome: Progressing   Problem: Nutrition: Goal: Adequate nutrition will be maintained Outcome: Progressing   Problem: Safety: Goal: Ability to remain free from injury will improve Outcome: Progressing   

## 2020-09-19 NOTE — Progress Notes (Signed)
[facility] PROGRESS NOTE    Progress Note  Melanie [name]  MBT:[id number] DOB: 12-06-1965 DOA: 09/18/2020 PCP: Patient, No Pcp Per (Inactive)     Brief Narrative:   Melanie [name] is an 55 y.o. female past medical history kidney transplant on the right on chronic immunosuppressive therapy since 2013, who comes in complaining of diarrhea and abdominal pain.  She relates she has been follow-up by Dr. Janee Morn as an outpatient and she was having 3 bowel movements a day after her episode of pancreatitis on March 2022 but her bowel movements have increased to 6-7 nonbloody.  He was placed on Imodium which helped.  Recently she started having nausea with poor appetite and about a 16 pound weight loss.  CT scan of the abdomen and pelvis was done in the ED that showed diverticulitis involving the ascending colon.  Assessment/Plan:   Acute  Diverticulitis: C. difficile PCR and GI panel. Started on Rocephin and Flagyl along with Florastor twice a day. Continue IV morphine for severe pain. She will need a colonoscopy as an outpatient once is resolved. She has remained afebrile with no leukocytosis.  Possible acute urinary tract infection: Urine cultures and blood cultures were sent. She was started empirically on IV Rocephin.  AKI (acute kidney injury) (HCC) Back in March 2022 her creatinine was around 1, in the setting of nausea vomiting and GI losses. Likely prerenal she was started on IV fluids her creatinine is improving slowly this morning is 1.6 Renal is on board and appreciate assistance.  Non-anion gap metabolic acidosis: Likely due to GI losses she will probably need bicarbonate, will defer to renal.  Essential hypertension: Continue current regimen blood pressure seems to be at goal.    Unintentional weight loss: About 16 pounds since March will need to follow-up with GI as an outpatient. She will need a colonoscopy. TSH 1.1.  History of hypokalemia: Continue potassium  supplementation twice a day potassium seems to be unremarkable.  Normocytic anemia: She relates no melanotic stools or bright red blood per rectum, she will need a colonoscopy as an outpatient. Continue to monitor hemoglobin.    DVT prophylaxis: lovenox Family Communication:none Status is: Inpatient  Remains inpatient appropriate because:Hemodynamically unstable  Dispo: The patient is from: Home              Anticipated d/c is to: Home              Patient currently is not medically stable to d/c.   Difficult to place patient No    Code Status:     Code Status Orders  (From admission, onward)           Start     Ordered   09/18/20 1507  Full code  Continuous        09/18/20 1509           Code Status History     This patient has a current code status but no historical code status.         IV Access:   Peripheral IV   Procedures and diagnostic studies:   CT ABDOMEN PELVIS WO CONTRAST  Result Date: 09/18/2020 CLINICAL DATA:  Abdominal pain, concern for diverticulitis. EXAM: CT ABDOMEN AND PELVIS WITHOUT CONTRAST TECHNIQUE: Multidetector CT imaging of the abdomen and pelvis was performed following the standard protocol without IV contrast. COMPARISON:  MR abdomen dated 07/13/2020 and CT abdomen pelvis dated 06/11/2020. FINDINGS: Lower chest: No acute abnormality. Hepatobiliary: A small cyst is seen in  the right hepatic lobe, measuring 4 mm. Status post cholecystectomy. No biliary dilatation. Pancreas: Unremarkable. No pancreatic ductal dilatation or surrounding inflammatory changes. Spleen: Normal in size without focal abnormality. Adrenals/Urinary Tract: Adrenal glands are unremarkable. The native kidneys are atrophic with a few small cysts. No hydronephrosis. A right lower quadrant transplant kidney is noted with a nonobstructive 6 mm calculus in the lower pole. No hydronephrosis or focal lesion. Bladder is unremarkable. Stomach/Bowel: Stomach is within normal  limits. Appendix appears normal. Diverticula are seen in the ascending colon with mild associated fat stranding, most consistent with diverticulitis. There are also diverticula and minimal associated fat stranding involving the descending colon (series 3, images 47-52). No evidence of abscess or fistula formation in either location. No evidence of bowel obstruction. Vascular/Lymphatic: Aortic atherosclerosis. No enlarged abdominal or pelvic lymph nodes. Reproductive: Multiple uterine fibroids are noted. Other: Focal diastasis in the anterior abdominal wall is unchanged. No abdominopelvic ascites. Musculoskeletal: No acute or significant osseous findings. IMPRESSION: Findings most consistent with diverticulitis involving the ascending colon and to a lesser extent the descending colon. No evidence of abscess or fistula formation. Electronically Signed   By: Romona Curls M.D.   On: 09/18/2020 16:01     Medical Consultants:   None.   Subjective:    Melanie [name] relates her and pain and her diarrhea are improved.  Objective:    Vitals:   09/18/20 2201 09/18/20 2351 09/19/20 0509 09/19/20 0835  BP: 130/86 130/71 110/72   Pulse: 79 83 79   Resp: 20 16 16 16   Temp: 99.1 F (37.3 C) 99.2 F (37.3 C) 98.6 F (37 C) 98.7 F (37.1 C)  TempSrc: Oral Oral Oral Oral  SpO2: 99% 100% 100%   Weight:   77 kg    SpO2: 100 %   Intake/Output Summary (Last 24 hours) at 09/19/2020 11/20/2020 Last data filed at 09/18/2020 2353 Gross per 24 hour  Intake 2110.96 ml  Output --  Net 2110.96 ml   Filed Weights   09/19/20 0509  Weight: 77 kg    Exam: General exam: In no acute distress. Respiratory system: Good air movement and clear to auscultation. Cardiovascular system: S1 & S2 heard, RRR. No JVD.  Gastrointestinal system: Abdomen is nondistended, soft and nontender.  Extremities: No pedal edema. Skin: No rashes, lesions or ulcers Psychiatry: Judgement and insight appear normal. Mood & affect  appropriate.    Data Reviewed:    Labs: Basic Metabolic Panel: Recent Labs  Lab 09/18/20 1014 09/19/20 0037  NA 135 138  K 4.0 3.0*  CL 113* 117*  CO2 13* 14*  GLUCOSE 146* 79  BUN 24* 19  CREATININE 2.43* 1.63*  CALCIUM 9.2 8.5*   GFR CrCl cannot be calculated (Unknown ideal weight.). Liver Function Tests: Recent Labs  Lab 09/18/20 1014  AST 17  ALT 44  ALKPHOS 81  BILITOT 0.5  PROT 5.9*  ALBUMIN 3.1*   Recent Labs  Lab 09/18/20 1014  LIPASE 31   No results for input(s): AMMONIA in the last 168 hours. Coagulation profile No results for input(s): INR, PROTIME in the last 168 hours. COVID-19 Labs  No results for input(s): DDIMER, FERRITIN, LDH, CRP in the last 72 hours.  Lab Results  Component Value Date   SARSCOV2NAA NEGATIVE 09/18/2020    CBC: Recent Labs  Lab 09/18/20 1014 09/19/20 0037  WBC 4.1 4.1  HGB 12.1 10.4*  HCT 36.2 31.2*  MCV 93.3 93.4  PLT 289 223   Cardiac Enzymes:  No results for input(s): CKTOTAL, CKMB, CKMBINDEX, TROPONINI in the last 168 hours. BNP (last 3 results) No results for input(s): PROBNP in the last 8760 hours. CBG: Recent Labs  Lab 09/19/20 0838  GLUCAP 100*   D-Dimer: No results for input(s): DDIMER in the last 72 hours. Hgb A1c: No results for input(s): HGBA1C in the last 72 hours. Lipid Profile: No results for input(s): CHOL, HDL, LDLCALC, TRIG, CHOLHDL, LDLDIRECT in the last 72 hours. Thyroid function studies: Recent Labs    09/18/20 2252  TSH 1.164   Anemia work up: No results for input(s): VITAMINB12, FOLATE, FERRITIN, TIBC, IRON, RETICCTPCT in the last 72 hours. Sepsis Labs: Recent Labs  Lab 09/18/20 1014 09/19/20 0037  WBC 4.1 4.1   Microbiology Recent Results (from the past 240 hour(s))  SARS CORONAVIRUS 2 (TAT 6-24 HRS)     Status: None   Collection Time: 09/18/20  1:10 PM  Result Value Ref Range Status   SARS Coronavirus 2 NEGATIVE NEGATIVE Final    Comment: (NOTE) SARS-CoV-2  target nucleic acids are NOT DETECTED.  The SARS-CoV-2 RNA is generally detectable in upper and lower respiratory specimens during the acute phase of infection. Negative results do not preclude SARS-CoV-2 infection, do not rule out co-infections with other pathogens, and should not be used as the sole basis for treatment or other patient management decisions. Negative results must be combined with clinical observations, patient history, and epidemiological information. The expected result is Negative.  Fact Sheet for Patients: HairSlick.no  Fact Sheet for Healthcare Providers: quierodirigir.com  This test is not yet approved or cleared by the [place] FDA and  has been authorized for detection and/or diagnosis of SARS-CoV-2 by FDA under an Emergency Use Authorization (EUA). This EUA will remain  in effect (meaning this test can be used) for the duration of the COVID-19 declaration under Se ction 564(b)(1) of the Act, 21 U.S.C. section 360bbb-3(b)(1), unless the authorization is terminated or revoked sooner.  Performed at [facility], [address] [place], [place] [address]      Medications:    famotidine  20 mg Oral Daily   ferrous sulfate  325 mg Oral Q breakfast   heparin  5,000 Units Subcutaneous Q8H   metoprolol tartrate  100 mg Oral BID   mycophenolate  360 mg Oral BID   NIFEdipine  60 mg Oral BID   predniSONE  5 mg Oral Q breakfast   saccharomyces boulardii  250 mg Oral BID   simvastatin  20 mg Oral QHS   sodium chloride flush  3 mL Intravenous Q12H   tacrolimus  3 mg Oral q AM   tacrolimus  4 mg Oral QHS   Continuous Infusions:  cefTRIAXone (ROCEPHIN)  IV 2 g (09/18/20 2246)   lactated ringers 125 mL/hr at 09/19/20 0403   metronidazole 500 mg (09/18/20 2359)      LOS: 1 day   Marinda Elk  [facility]  09/19/2020, 9:22 AM

## 2020-09-19 NOTE — Consult Note (Addendum)
[facility] Nephrology Consultation Note  Requesting MD: Dr David StallFeliz Ortiz Reason for consult: AKI  HPI:  Melanie [name] is a 55 y.o. female with history of hypertension, ESRD status [facility] [place] with Dr. Elvis CoilMartin Webb at [facility], admitted with acute diverticulitis seen as a consultation for the management of acute kidney injury. Patient reported that she was on dialysis via AV fistula since 2011-2013 when she had kidney transplant with good allograft function.  The baseline creatinine level seems to be around 0.7-1.0.  She is on Prograf, Myfortic and prednisone 5 mg.  She last saw Dr. Anette RiedelWei pain 02/2020 and then lost follow-up.  She reports not feeling well since March of this year associated with nausea vomiting decreased oral intake associated with loose bowel movement.  She also lost significant amount of weight.  Reportedly she was taking her transplant medications as prescribed.  She denies fever, chills. In the ER, blood pressure acceptable.  The labs showed creatinine level 2.43, potassium 4, CO2 13, UA with UTI.  The CT scan showed acute diverticulitis and nonobstructing nephrolithiasis in transplanted kidney without any hydronephrosis.  She was treated with IV fluid and started empiric antibiotics including ceftriaxone and Flagyl. This morning, the creatinine level improved to 1.63, potassium 3.6. She feels weak but denies any chest pain, shortness of breath, headache or dizziness.  Nausea vomiting is slightly better today.  Creatinine, Ser  Date/Time Value Ref Range Status  09/19/2020 08:23 AM 1.63 (H) 0.44 - 1.00 mg/dL Final  16/10/960407/03/2020 54:0912:37 AM 1.63 (H) 0.44 - 1.00 mg/dL Final  81/19/147806/30/2022 29:5610:14 AM 2.43 (H) 0.44 - 1.00 mg/dL Final  21/30/865703/23/2022 84:6901:45 PM 1.01 (H) 0.44 - 1.00 mg/dL Final  62/95/284102/15/2021 32:4410:37 AM 0.75 0.44 - 1.00 mg/dL Final  01/02/725308/16/2020 66:4403:09 PM 0.62 0.44 - 1.00 mg/dL Final     PMHx:   Past Medical History:  Diagnosis  Date   Anxiety    Blood transfusion without reported diagnosis    Dyspnea    Headache    Kidney transplanted 2013   right   Neuromuscular disorder (HCC)    Renal disorder    Sleep apnea     Past Surgical History:  Procedure Laterality Date   DILATION AND CURETTAGE OF UTERUS     KIDNEY TRANSPLANT  2013   NEPHRECTOMY TRANSPLANTED ORGAN      Family Hx:  Family History  Problem Relation Age of Onset   Diabetes Father    Hypertension Father    Alcohol abuse Maternal Grandmother    Alcohol abuse Maternal Grandfather    Alcohol abuse Paternal Grandmother    Diabetes Paternal Grandmother    Alcohol abuse Paternal Grandfather     Social History:  reports that she has been smoking cigars. She has never used smokeless tobacco. She reports current alcohol use. She reports current drug use. Drug: Marijuana.  Allergies: No Known Allergies  Medications: Prior to Admission medications   Medication Sig Start Date End Date Taking? Authorizing Provider  famotidine (PEPCID) 20 MG tablet Take 20 mg by mouth as needed for heartburn or indigestion.   Yes [provider]  ferrous sulfate 325 (65 FE) MG tablet Take 325 mg by mouth daily with breakfast.   Yes [provider]  megestrol (MEGACE) 40 MG tablet Take 0.5 tablets (20 mg total) by mouth daily. Can increase to two tablets twice a day in the event of heavy bleeding 07/02/19  Yes Hermina StaggersErvin, Michael L, MD  metoprolol  tartrate (LOPRESSOR) 100 MG tablet Take 100 mg by mouth 2 (two) times daily.   Yes [provider]  mycophenolate (MYFORTIC) 360 MG TBEC EC tablet Take 360 mg by mouth 2 (two) times daily.   Yes [provider]  NIFEdipine (ADALAT CC) 60 MG 24 hr tablet Take 60 mg by mouth 2 (two) times daily.   Yes [provider]  oxyCODONE-acetaminophen (PERCOCET/ROXICET) 5-325 MG tablet Take 1 tablet by mouth every 8 (eight) hours as needed for severe pain. 06/11/20  Yes Khatri, Hina, PA-C  potassium  chloride SA (K-DUR) 20 MEQ tablet Take 20 mEq by mouth 2 (two) times daily.   Yes [provider]  predniSONE (DELTASONE) 5 MG tablet Take 5 mg by mouth daily with breakfast.   Yes [provider]  simvastatin (ZOCOR) 20 MG tablet Take 20 mg by mouth at bedtime.   Yes [provider]  tacrolimus (PROGRAF) 1 MG capsule Take 3-4 mg by mouth See admin instructions. Take 3 mg tablet by mouth in the morning and 4 mg tablet by mouth at night.   Yes [provider]    I have reviewed the patient's current medications.  Labs:  Results for orders placed or performed during the hospital encounter of 09/18/20 (from the past 48 hour(s))  Lipase, blood     Status: None   Collection Time: 09/18/20 10:14 AM  Result Value Ref Range   Lipase 31 11 - 51 U/L    Comment: Performed at [facility], [address], [place], [place] [address]  Comprehensive metabolic panel     Status: Abnormal   Collection Time: 09/18/20 10:14 AM  Result Value Ref Range   Sodium 135 135 - 145 mmol/L   Potassium 4.0 3.5 - 5.1 mmol/L   Chloride 113 (H) 98 - 111 mmol/L   CO2 13 (L) 22 - 32 mmol/L   Glucose, Bld 146 (H) 70 - 99 mg/dL    Comment: Glucose reference range applies only to samples taken after fasting for at least 8 hours.   BUN 24 (H) 6 - 20 mg/dL   Creatinine, Ser 1.76 (H) 0.44 - 1.00 mg/dL   Calcium 9.2 8.9 - 16.0 mg/dL   Total Protein 5.9 (L) 6.5 - 8.1 g/dL   Albumin 3.1 (L) 3.5 - 5.0 g/dL   AST 17 15 - 41 U/L   ALT 44 0 - 44 U/L   Alkaline Phosphatase 81 38 - 126 U/L   Total Bilirubin 0.5 0.3 - 1.2 mg/dL   GFR, Estimated 23 (L) >60 mL/min    Comment: (NOTE) Calculated using the CKD-EPI Creatinine Equation (2021)    Anion gap 9 5 - 15    Comment: Performed at [facility], [address], [place], [place] [address]  CBC     Status: None   Collection Time: 09/18/20 10:14 AM  Result Value Ref Range   WBC 4.1 4.0 - 10.5 K/uL   RBC 3.88 3.87 - 5.11 MIL/uL    Hemoglobin 12.1 12.0 - 15.0 g/dL   HCT 62.6 94.8 - 54.6 %   MCV 93.3 80.0 - 100.0 fL   MCH 31.2 26.0 - 34.0 pg   MCHC 33.4 30.0 - 36.0 g/dL   RDW 27.0 35.0 - 09.3 %   Platelets 289 150 - 400 K/uL   nRBC 0.0 0.0 - 0.2 %    Comment: Performed at [facility], [address] [place], [place] [address]  Urinalysis,  Routine w reflex microscopic Urine, Clean Catch     Status: Abnormal   Collection Time: 09/18/20 10:14 AM  Result Value Ref Range   Color, Urine YELLOW YELLOW   APPearance TURBID (A) CLEAR   Specific Gravity, Urine 1.012 1.005 - 1.030   pH 5.0 5.0 - 8.0   Glucose, UA NEGATIVE NEGATIVE mg/dL   Hgb urine dipstick SMALL (A) NEGATIVE   Bilirubin Urine NEGATIVE NEGATIVE   Ketones, ur NEGATIVE NEGATIVE mg/dL   Protein, ur 888 (A) NEGATIVE mg/dL   Nitrite POSITIVE (A) NEGATIVE   Leukocytes,Ua LARGE (A) NEGATIVE   RBC / HPF 11-20 0 - 5 RBC/hpf   WBC, UA >50 (H) 0 - 5 WBC/hpf   Bacteria, UA MANY (A) NONE SEEN   Squamous Epithelial / LPF 11-20 0 - 5   WBC Clumps PRESENT     Comment: Performed at [facility], [address] [place], [place] [address]  SARS CORONAVIRUS 2 (TAT 6-24 HRS)     Status: None   Collection Time: 09/18/20  1:10 PM  Result Value Ref Range   SARS Coronavirus 2 NEGATIVE NEGATIVE    Comment: (NOTE) SARS-CoV-2 target nucleic acids are NOT DETECTED.  The SARS-CoV-2 RNA is generally detectable in upper and lower respiratory specimens during the acute phase of infection. Negative results do not preclude SARS-CoV-2 infection, do not rule out co-infections with other pathogens, and should not be used as the sole basis for treatment or other patient management decisions. Negative results must be combined with clinical observations, patient history, and epidemiological information. The expected result is Negative.  Fact Sheet for Patients: HairSlick.no  Fact Sheet for Healthcare  Providers: quierodirigir.com  This test is not yet approved or cleared by the [place] FDA and  has been authorized for detection and/or diagnosis of SARS-CoV-2 by FDA under an Emergency Use Authorization (EUA). This EUA will remain  in effect (meaning this test can be used) for the duration of the COVID-19 declaration under Se ction 564(b)(1) of the Act, 21 U.S.C. section 360bbb-3(b)(1), unless the authorization is terminated or revoked sooner.  Performed at [facility], [address] [place], [place] [address]   HIV Antibody (routine testing w rflx)     Status: None   Collection Time: 09/18/20 10:52 PM  Result Value Ref Range   HIV Screen 4th Generation wRfx Non Reactive Non Reactive    Comment: Performed at [facility], [address] [place], [place] [address]  TSH     Status: None   Collection Time: 09/18/20 10:52 PM  Result Value Ref Range   TSH 1.164 0.350 - 4.500 uIU/mL    Comment: Performed by a 3rd Generation assay with a functional sensitivity of <=0.01 uIU/mL. Performed at [facility], [address] [place], [place] [address]   CBC     Status: Abnormal   Collection Time: 09/19/20 12:37 AM  Result Value Ref Range   WBC 4.1 4.0 - 10.5 K/uL   RBC 3.34 (L) 3.87 - 5.11 MIL/uL   Hemoglobin 10.4 (L) 12.0 - 15.0 g/dL   HCT 91.5 (L) 05.6 - 97.9 %   MCV 93.4 80.0 - 100.0 fL   MCH 31.1 26.0 - 34.0 pg   MCHC 33.3 30.0 - 36.0 g/dL   RDW 48.0 16.5 - 53.7 %   Platelets 223 150 - 400 K/uL   nRBC 0.0 0.0 - 0.2 %    Comment: Performed at [facility]  Lab, [address], [place], [place] [address]  Basic metabolic panel     Status: Abnormal   Collection Time: 09/19/20 12:37 AM  Result Value Ref Range   Sodium 138 135 - 145 mmol/L   Potassium 3.0 (L) 3.5 - 5.1 mmol/L   Chloride 117 (H) 98 - 111 mmol/L   CO2 14 (L) 22 - 32 mmol/L   Glucose, Bld 79 70 - 99 mg/dL    Comment: Glucose reference range applies only to samples  taken after fasting for at least 8 hours.   BUN 19 6 - 20 mg/dL   Creatinine, Ser 9.17 (H) 0.44 - 1.00 mg/dL   Calcium 8.5 (L) 8.9 - 10.3 mg/dL   GFR, Estimated 37 (L) >60 mL/min    Comment: (NOTE) Calculated using the CKD-EPI Creatinine Equation (2021)    Anion gap 7 5 - 15    Comment: Performed at [facility] Lab, [address], [place], [place] [address]  Renal function panel     Status: Abnormal   Collection Time: 09/19/20  8:23 AM  Result Value Ref Range   Sodium 137 135 - 145 mmol/L   Potassium 3.6 3.5 - 5.1 mmol/L   Chloride 119 (H) 98 - 111 mmol/L   CO2 12 (L) 22 - 32 mmol/L   Glucose, Bld 97 70 - 99 mg/dL    Comment: Glucose reference range applies only to samples taken after fasting for at least 8 hours.   BUN 18 6 - 20 mg/dL   Creatinine, Ser 6.97 (H) 0.44 - 1.00 mg/dL   Calcium 8.5 (L) 8.9 - 10.3 mg/dL   Phosphorus 2.1 (L) 2.5 - 4.6 mg/dL   Albumin 2.5 (L) 3.5 - 5.0 g/dL   GFR, Estimated 37 (L) >60 mL/min    Comment: (NOTE) Calculated using the CKD-EPI Creatinine Equation (2021)    Anion gap 6 5 - 15    Comment: Performed at [facility], [address], [place], [place] [address]  Glucose, capillary     Status: Abnormal   Collection Time: 09/19/20  8:38 AM  Result Value Ref Range   Glucose-Capillary 100 (H) 70 - 99 mg/dL    Comment: Glucose reference range applies only to samples taken after fasting for at least 8 hours.     ROS:  Pertinent items noted in HPI and remainder of comprehensive ROS otherwise negative.  Physical Exam: Vitals:   09/19/20 0509 09/19/20 0835  BP: 110/72   Pulse: 79   Resp: 16 16  Temp: 98.6 F (37 C) 98.7 F (37.1 C)  SpO2: 100%      General exam: Appears calm and comfortable, dry mucous membrane Respiratory system: Clear to auscultation. Respiratory effort normal. No wheezing or crackle Cardiovascular system: S1 & S2 heard, RRR.  No pedal edema. Gastrointestinal system: Abdomen is nondistended, soft. Normal bowel  sounds heard.  No allograft tenderness Central nervous system: Alert and oriented. No focal neurological deficits. Extremities: Symmetric 5 x 5 power. Skin: No rashes, lesions or ulcers Psychiatry: Judgement and insight appear normal. Mood & affect appropriate.   Assessment/Plan:  #Acute diverticulitis of ascending and lesser extent to descending colon causing nausea vomiting and diarrhea: Pending CDiff.  Currently on Flagyl and ceftriaxone and supportive care per primary team.  #Acute kidney injury likely hemodynamically mediated in the setting of dehydration concomitant with urinary tract infection.  CT scan ruled out hydronephrosis of transplanted kidney.  UA with urinary tract infection therefore on antibiotics.  Continue  IV fluid as ordered.  Given diarrhea, CMV serology was sent.  Daily lab, strict ins and out.  #Kidney transplant on chronic immunosuppression: DDKT in 2013 at [facility].  Continue Prograf and prednisone.  I will hold Myfortic because of GI issues for few days.  Prograf level sent last night twice but unknown if it was true 12 hours trough.  #Non-anion gap metabolic acidosis due to GI loss: Switching IV fluid with sodium bicarbonate.  Monitor lab.  #Hypertension: Blood pressure acceptable.  Continue to monitor.  On nifedipine.  #Hypokalemia: Received IV KCl repletion.  With starting sodium bicarbonate and IV fluid and ongoing GI loss, I will start oral KCl.  Discussed with the primary team. Thank you for the consult.  We will follow with you.  Melaney Tellefsen Jaynie Collins 09/19/2020, 10:15 AM  [employer].

## 2020-09-19 NOTE — Progress Notes (Addendum)
Initial Nutrition Assessment  DOCUMENTATION CODES:   Non-severe (moderate) malnutrition in context of chronic illness  INTERVENTION:   -Ensure Enlive po TID, each supplement provides 350 kcal and 20 grams of protein  -MVI with minerals daily  NUTRITION DIAGNOSIS:   Moderate Malnutrition related to chronic illness (diverticulitis) as evidenced by percent weight loss, mild fat depletion, moderate fat depletion, mild muscle depletion, moderate muscle depletion, energy intake < or equal to 75% for > or equal to 1 month.  GOAL:   Patient will meet greater than or equal to 90% of their needs  MONITOR:   PO intake, Supplement acceptance, Labs, Weight trends, Skin, I & O's  REASON FOR ASSESSMENT:   Malnutrition Screening Tool    ASSESSMENT:   Melanie [name] is a 55 y.o. female with medical history significant of s/p right kidney transplant on chronic immunosuppressive therapy since 2013 who presents with complaints of diarrhea and abdominal pain.  Pt admitted with diarrhea secondary to diverticulitis and UTI.   Reviewed I/O's: +2.1 L x 24 hours  Spoke with pt and wife at bedside. Per pt, she has experienced a general decline in health over the past 3 months. She explains that her intake has decreased significantly due to abdominal pain, poor appetite, and diarrhea. Per her report, she consumes "3 meals per week", which consists of snack foods, such as peanut butter crackers or fruit. Pt reports her main source of nutrition has been water in order to stay hydrated.   Pt excited to try solid foods- noted she consumed a few french fries, cobbler, and roast beef. She repots appetite, pain, and diarrhea have improved since starting IVF and antibiotics.   Pt endorses wt loss, but unsure of UBW. Reviewed wt hx; pt has experienced a 7.5% wt loss over the past 3 months, which is significant for time frame.   Discussed importance of good meal and supplement intake to promote healing. Pt amenable  to Ensure supplements.   Medications reviewed and include ferrous sulfate, florastor, prograf, and lactated ringers infusion @ 125 ml/hr.   Labs reviewed: K: 3.0, CBGS: 100.   NUTRITION - FOCUSED PHYSICAL EXAM:  Flowsheet Row Most Recent Value  Orbital Region Mild depletion  Upper Arm Region Mild depletion  Thoracic and Lumbar Region No depletion  Buccal Region Mild depletion  Temple Region Mild depletion  Clavicle Bone Region Moderate depletion  Clavicle and Acromion Bone Region Mild depletion  Scapular Bone Region Mild depletion  Dorsal Hand Moderate depletion  Patellar Region Moderate depletion  Anterior Thigh Region Moderate depletion  Posterior Calf Region Moderate depletion  Edema (RD Assessment) None  Hair Reviewed  Eyes Reviewed  Mouth Reviewed  Skin Reviewed  Nails Reviewed        Diet Order:   Diet Order             Diet Heart Room service appropriate? Yes; Fluid consistency: Thin  Diet effective now                   EDUCATION NEEDS:   Education needs have been addressed  Skin:  Skin Assessment: Reviewed RN Assessment  Last BM:  09/18/20  Height:   Ht Readings from Last 1 Encounters:  10/16/19 5\' 4"  (1.626 m)    Weight:   Wt Readings from Last 1 Encounters:  09/19/20 77 kg    Ideal Body Weight:  54.5 kg  BMI:  Body mass index is 29.14 kg/m.  Estimated Nutritional Needs:   Kcal:  1900-2100  Protein:  95-110 grams  Fluid:  > 1.9 L    Levada Schilling, RD, LDN, CDCES Registered Dietitian II Certified Diabetes Care and Education Specialist Please refer to [facility] for RD and/or RD on-call/weekend/after hours pager

## 2020-09-19 NOTE — TOC Initial Note (Signed)
Transition of Care [facility]) - Initial/Assessment Note    Patient Details  Name: Melanie [name] MRN: [id number] Date of Birth: 09-06-1965  [facility]) CM/SW Contact:    Lockie Pares, RN Phone Number: 09/19/2020, 4:39 PM  Clinical Narrative:                  Patient admitted with diverticulitis,  prior kidney transplant with immunosuppresives, compliant No insurance listed in record at this time, however is employed with [employer]  and has had [employer]. Attempted to call patient for discharge plans and insurance verification, however there was no answer. CM will follow patient for needs  Expected Discharge Plan: Home/Self Care Barriers to Discharge: Continued Medical Work up   Patient Goals and CMS Choice        Expected Discharge Plan and Services Expected Discharge Plan: Home/Self Care   Discharge Planning Services: CM Consult   Living arrangements for the past 2 months: Single Family Home                                      Prior Living Arrangements/Services Living arrangements for the past 2 months: Single Family Home Lives with:: Spouse Patient language and need for interpreter reviewed:: Yes        Need for Family Participation in Patient Care: Yes (Comment) Care giver support system in place?: Yes (comment)   Criminal Activity/Legal Involvement Pertinent to Current Situation/Hospitalization: No - Comment as needed  Activities of Daily Living Home Assistive Devices/Equipment: None ADL Screening (condition at time of admission) Patient's cognitive ability adequate to safely complete daily activities?: Yes Is the patient deaf or have difficulty hearing?: No Does the patient have difficulty seeing, even when wearing glasses/contacts?: No Does the patient have difficulty concentrating, remembering, or making decisions?: No Patient able to express need for assistance with ADLs?: No Does the patient have difficulty dressing or bathing?:  No Independently performs ADLs?: Yes (appropriate for developmental age) Does the patient have difficulty walking or climbing stairs?: No Weakness of Legs: None Weakness of Arms/Hands: None  Permission Sought/Granted                  Emotional Assessment       Orientation: : Oriented to Self, Oriented to Place, Oriented to  Time Alcohol / Substance Use: Not Applicable Psych Involvement: No (comment)  Admission diagnosis:  Acute cystitis with hematuria [N30.01] AKI (acute kidney injury) (HCC) [N17.9] Patient Active Problem List   Diagnosis Date Noted   AKI (acute kidney injury) (HCC) 09/18/2020   Diverticulitis 09/18/2020   Acute lower UTI 09/18/2020   S/P kidney transplant 09/18/2020   Essential hypertension 09/18/2020   Diarrhea 09/18/2020   Weight loss 09/18/2020   History of immunosuppressive therapy 09/18/2020   Visit for routine gyn exam 06/29/2019   Screening mammogram, encounter for 06/29/2019   Menorrhagia 06/29/2019   PCP:  Patient, No Pcp Per (Inactive) Pharmacy:   [facility] - [place] - [address] [place] ([place]) [place] [address] Phone: [phone] Fax: [phone]     Social Determinants of Health (SDOH) Interventions    Readmission Risk Interventions No flowsheet data found.

## 2020-09-20 DIAGNOSIS — E440 Moderate protein-calorie malnutrition: Secondary | ICD-10-CM | POA: Insufficient documentation

## 2020-09-20 LAB — GASTROINTESTINAL PANEL BY PCR, STOOL (REPLACES STOOL CULTURE)

## 2020-09-20 LAB — BASIC METABOLIC PANEL
Anion gap: 8 (ref 5–15)
Anion gap: 9 (ref 5–15)
BUN: 15 mg/dL (ref 6–20)
BUN: 12 mg/dL (ref 6–20)
CO2: 17 mmol/L — ABNORMAL LOW (ref 22–32)
CO2: 20 mmol/L — ABNORMAL LOW (ref 22–32)
Calcium: 8.4 mg/dL — ABNORMAL LOW (ref 8.9–10.3)
Calcium: 7.6 mg/dL — ABNORMAL LOW (ref 8.9–10.3)
Chloride: 112 mmol/L — ABNORMAL HIGH (ref 98–111)
Chloride: 109 mmol/L (ref 98–111)
Creatinine, Ser: 2 mg/dL — ABNORMAL HIGH (ref 0.44–1.00)
Creatinine, Ser: 1.96 mg/dL — ABNORMAL HIGH (ref 0.44–1.00)
GFR, Estimated: 29 mL/min — ABNORMAL LOW (ref >60)
GFR, Estimated: 30 mL/min — ABNORMAL LOW (ref >60)
Glucose, Bld: 88 mg/dL (ref 70–99)
Glucose, Bld: 96 mg/dL (ref 70–99)
Potassium: 2.8 mmol/L — ABNORMAL LOW (ref 3.5–5.1)
Potassium: 3.2 mmol/L — ABNORMAL LOW (ref 3.5–5.1)
Sodium: 137 mmol/L (ref 135–145)
Sodium: 138 mmol/L (ref 135–145)

## 2020-09-20 LAB — C DIFFICILE QUICK SCREEN W PCR REFLEX
C Diff antigen: NEGATIVE
C Diff interpretation: NOT DETECTED
C Diff toxin: NEGATIVE

## 2020-09-20 LAB — CMV IGM: CMV IgM: <30 AU/mL (ref 0.0–29.9)

## 2020-09-20 LAB — CMV ANTIBODY, IGG (EIA): CMV Ab - IgG: >10 U/mL — ABNORMAL HIGH (ref 0.00–0.59)

## 2020-09-20 MED ORDER — POTASSIUM CHLORIDE CRYS ER 20 MEQ PO TBCR: 40.0000 meq | EXTENDED_RELEASE_TABLET | Freq: Once | ORAL | Status: DC

## 2020-09-20 MED ORDER — POTASSIUM CHLORIDE CRYS ER 20 MEQ PO TBCR
40.0000 meq | EXTENDED_RELEASE_TABLET | Freq: Three times a day (TID) | ORAL | Status: AC
  Administered 2020-09-20 (×2): 40 meq via ORAL
  Filled 2020-09-20 (×2): qty 2

## 2020-09-20 MED ORDER — SODIUM CHLORIDE 0.9 % IV BOLUS
1500.0000 mL | Freq: Once | INTRAVENOUS | Status: AC
  Administered 2020-09-20: 1500 mL via INTRAVENOUS

## 2020-09-20 MED ORDER — PSYLLIUM 95 % PO PACK
1.0000 | PACK | Freq: Every day | ORAL | Status: DC
  Administered 2020-09-20: 1 via ORAL
  Filled 2020-09-20 (×6): qty 1

## 2020-09-20 MED ORDER — SODIUM CHLORIDE 0.9 % IV SOLN: INTRAVENOUS | Status: DC

## 2020-09-20 NOTE — Progress Notes (Signed)
Chart/lab results reviewed for GI panel at request of Dara,RN.  GI panel - Norvirus detected. C-diff is negative. Pt is ambulatory to bedside commode.  Standard precautions appropriate at this time.

## 2020-09-20 NOTE — Progress Notes (Signed)
[facility] PROGRESS NOTE    Progress Note  Melanie [name]  LOV:[id number] DOB: 12-03-1965 DOA: 09/18/2020 PCP: Patient, No Pcp Per (Inactive)     Brief Narrative:   Melanie [name] is an 55 y.o. female past medical history kidney transplant on the right on chronic immunosuppressive therapy since 2013, who comes in complaining of diarrhea and abdominal pain.  She relates she has been follow-up by Dr. Janee Morn as an outpatient and she was having 3 bowel movements a day after her episode of pancreatitis on March 2022 but her bowel movements have increased to 6-7 nonbloody.  He was placed on Imodium which helped.  Recently she started having nausea with poor appetite and about a 16 pound weight loss.  CT scan of the abdomen and pelvis was done in the ED that showed diverticulitis involving the ascending colon.  Assessment/Plan:   Acute  Diverticulitis: C. difficile PCR and GI panel negative Started on Rocephin and Flagyl along with Florastor twice a day. Has remained afebrile bowel movements have significantly improved.  Possible acute urinary tract infection: Currently on Rocephin should help with urinary tract infection.  AKI (acute kidney injury) (HCC) Back in March 2022 her creatinine was around 1, in the setting of nausea vomiting and GI losses. Creatinine worse today, will go ahead and bolus her with normal saline then continue, free water with bicarbonate.  Non-anion gap metabolic acidosis: Improving with bicarb IV continue for next 24 hours repeat basic metabolic panel in the morning  Essential hypertension: Continue current regimen blood pressure seems to be at goal.    Unintentional weight loss: About 16 pounds since March will need to follow-up with GI as an outpatient. She will need a colonoscopy. TSH 1.1.  History of hypokalemia: Continue potassium supplementation twice a day potassium seems to be unremarkable.  Normocytic anemia: She relates no melanotic stools or  bright red blood per rectum, she will need a colonoscopy as an outpatient. Continue to monitor hemoglobin.    DVT prophylaxis: lovenox Family Communication:none Status is: Inpatient  Remains inpatient appropriate because:Hemodynamically unstable  Dispo: The patient is from: Home              Anticipated d/c is to: Home              Patient currently is not medically stable to d/c.   Difficult to place patient No    Code Status:     Code Status Orders  (From admission, onward)           Start     Ordered   09/18/20 1507  Full code  Continuous        09/18/20 1509           Code Status History     This patient has a current code status but no historical code status.         IV Access:   Peripheral IV   Procedures and diagnostic studies:   CT ABDOMEN PELVIS WO CONTRAST  Result Date: 09/18/2020 CLINICAL DATA:  Abdominal pain, concern for diverticulitis. EXAM: CT ABDOMEN AND PELVIS WITHOUT CONTRAST TECHNIQUE: Multidetector CT imaging of the abdomen and pelvis was performed following the standard protocol without IV contrast. COMPARISON:  MR abdomen dated 07/13/2020 and CT abdomen pelvis dated 06/11/2020. FINDINGS: Lower chest: No acute abnormality. Hepatobiliary: A small cyst is seen in the right hepatic lobe, measuring 4 mm. Status post cholecystectomy. No biliary dilatation. Pancreas: Unremarkable. No pancreatic ductal dilatation or surrounding inflammatory changes. Spleen:  Normal in size without focal abnormality. Adrenals/Urinary Tract: Adrenal glands are unremarkable. The native kidneys are atrophic with a few small cysts. No hydronephrosis. A right lower quadrant transplant kidney is noted with a nonobstructive 6 mm calculus in the lower pole. No hydronephrosis or focal lesion. Bladder is unremarkable. Stomach/Bowel: Stomach is within normal limits. Appendix appears normal. Diverticula are seen in the ascending colon with mild associated fat stranding, most  consistent with diverticulitis. There are also diverticula and minimal associated fat stranding involving the descending colon (series 3, images 47-52). No evidence of abscess or fistula formation in either location. No evidence of bowel obstruction. Vascular/Lymphatic: Aortic atherosclerosis. No enlarged abdominal or pelvic lymph nodes. Reproductive: Multiple uterine fibroids are noted. Other: Focal diastasis in the anterior abdominal wall is unchanged. No abdominopelvic ascites. Musculoskeletal: No acute or significant osseous findings. IMPRESSION: Findings most consistent with diverticulitis involving the ascending colon and to a lesser extent the descending colon. No evidence of abscess or fistula formation. Electronically Signed   By: Romona Curls M.D.   On: 09/18/2020 16:01     Medical Consultants:   None.   Subjective:    Melanie [name] diarrhea worsened denies any abdominal pain tolerating her diet this morning  Objective:    Vitals:   09/19/20 2007 09/19/20 2351 09/20/20 0353 09/20/20 0822  BP: 135/81 124/85 129/88 133/82  Pulse: 75 60 77   Resp: 17 19 18 17   Temp: 98.3 F (36.8 C) 98.5 F (36.9 C) 97.6 F (36.4 C) 98.6 F (37 C)  TempSrc: Oral Oral Oral Oral  SpO2: 99% 100% 100%   Weight:       SpO2: 100 %   Intake/Output Summary (Last 24 hours) at 09/20/2020 0949 Last data filed at 09/19/2020 2200 Gross per 24 hour  Intake 944.93 ml  Output --  Net 944.93 ml    Filed Weights   09/19/20 0509  Weight: 77 kg    Exam: General exam: In no acute distress. Respiratory system: Good air movement and clear to auscultation. Cardiovascular system: S1 & S2 heard, RRR. No JVD. Gastrointestinal system: Abdomen is nondistended, soft and nontender.  Extremities: No pedal edema. Skin: No rashes, lesions or ulcers Psychiatry: Judgement and insight appear normal. Mood & affect appropriate.   Data Reviewed:    Labs: Basic Metabolic Panel: Recent Labs  Lab 09/18/20 1014  09/19/20 0037 09/19/20 0823 09/20/20 0054  NA 135 138 137 137  K 4.0 3.0* 3.6 2.8*  CL 113* 117* 119* 112*  CO2 13* 14* 12* 17*  GLUCOSE 146* 79 97 88  BUN 24* 19 18 15   CREATININE 2.43* 1.63* 1.63* 2.00*  CALCIUM 9.2 8.5* 8.5* 8.4*  PHOS  --   --  2.1*  --     GFR CrCl cannot be calculated (Unknown ideal weight.). Liver Function Tests: Recent Labs  Lab 09/18/20 1014 09/19/20 0823  AST 17  --   ALT 44  --   ALKPHOS 81  --   BILITOT 0.5  --   PROT 5.9*  --   ALBUMIN 3.1* 2.5*    Recent Labs  Lab 09/18/20 1014  LIPASE 31    No results for input(s): AMMONIA in the last 168 hours. Coagulation profile No results for input(s): INR, PROTIME in the last 168 hours. COVID-19 Labs  No results for input(s): DDIMER, FERRITIN, LDH, CRP in the last 72 hours.  Lab Results  Component Value Date   SARSCOV2NAA NEGATIVE 09/18/2020    CBC: Recent Labs  Lab 09/18/20 1014 09/19/20 0037  WBC 4.1 4.1  HGB 12.1 10.4*  HCT 36.2 31.2*  MCV 93.3 93.4  PLT 289 223    Cardiac Enzymes: No results for input(s): CKTOTAL, CKMB, CKMBINDEX, TROPONINI in the last 168 hours. BNP (last 3 results) No results for input(s): PROBNP in the last 8760 hours. CBG: Recent Labs  Lab 09/19/20 0838 09/19/20 1246  GLUCAP 100* 126*    D-Dimer: No results for input(s): DDIMER in the last 72 hours. Hgb A1c: No results for input(s): HGBA1C in the last 72 hours. Lipid Profile: No results for input(s): CHOL, HDL, LDLCALC, TRIG, CHOLHDL, LDLDIRECT in the last 72 hours. Thyroid function studies: Recent Labs    09/18/20 2252  TSH 1.164    Anemia work up: No results for input(s): VITAMINB12, FOLATE, FERRITIN, TIBC, IRON, RETICCTPCT in the last 72 hours. Sepsis Labs: Recent Labs  Lab 09/18/20 1014 09/19/20 0037  WBC 4.1 4.1    Microbiology Recent Results (from the past 240 hour(s))  SARS CORONAVIRUS 2 (TAT 6-24 HRS)     Status: None   Collection Time: 09/18/20  1:10 PM  Result  Value Ref Range Status   SARS Coronavirus 2 NEGATIVE NEGATIVE Final    Comment: (NOTE) SARS-CoV-2 target nucleic acids are NOT DETECTED.  The SARS-CoV-2 RNA is generally detectable in upper and lower respiratory specimens during the acute phase of infection. Negative results do not preclude SARS-CoV-2 infection, do not rule out co-infections with other pathogens, and should not be used as the sole basis for treatment or other patient management decisions. Negative results must be combined with clinical observations, patient history, and epidemiological information. The expected result is Negative.  Fact Sheet for Patients: HairSlick.no  Fact Sheet for Healthcare Providers: quierodirigir.com  This test is not yet approved or cleared by the [place] FDA and  has been authorized for detection and/or diagnosis of SARS-CoV-2 by FDA under an Emergency Use Authorization (EUA). This EUA will remain  in effect (meaning this test can be used) for the duration of the COVID-19 declaration under Se ction 564(b)(1) of the Act, 21 U.S.C. section 360bbb-3(b)(1), unless the authorization is terminated or revoked sooner.  Performed at [facility], [address], [place], [place] [address]   C Difficile Quick Screen w PCR reflex     Status: None   Collection Time: 09/18/20  2:50 PM   Specimen: STOOL  Result Value Ref Range Status   C Diff antigen NEGATIVE NEGATIVE Final   C Diff toxin NEGATIVE NEGATIVE Final   C Diff interpretation No C. difficile detected.  Final    Comment: Performed at [facility], [address] [place], [place] [address]     Medications:    famotidine  20 mg Oral Daily   feeding supplement  237 mL Oral TID BM   ferrous sulfate  325 mg Oral Q breakfast   heparin  5,000 Units Subcutaneous Q8H   metoprolol tartrate  100 mg Oral BID   multivitamin with minerals  1 tablet Oral Daily   NIFEdipine   60 mg Oral BID   predniSONE  5 mg Oral Q breakfast   saccharomyces boulardii  250 mg Oral BID   simvastatin  20 mg Oral QHS   sodium chloride flush  3 mL Intravenous Q12H   tacrolimus  3 mg Oral q AM   tacrolimus  4 mg Oral QHS   Continuous Infusions:  cefTRIAXone (ROCEPHIN)  IV 2 g (09/19/20 2320)  lactated ringers 50 mL/hr at 09/19/20 1214   metronidazole 500 mg (09/20/20 0904)    sodium bicarbonate (isotonic) infusion in sterile water 100 mL/hr at 09/20/20 0048      LOS: 2 days   Melanie [name]  [facility]  09/20/2020, 9:49 AM

## 2020-09-20 NOTE — Progress Notes (Signed)
Spoke with infection control nurse about pt GI panel confirming pt positive for norovirus. Asked nurse what would be the appropriate precautions for this pt. Infection control nurse stated that if pt is continent and not diapered then she would be standard precautions.

## 2020-09-20 NOTE — Progress Notes (Addendum)
[name] PROGRESS NOTE  Assessment/ Plan: Pt is a 55 y.o. yo female  with history of Melanie [name], Melanie Melanie [name], Melanie with acute diverticulitis seen as a consultation for the management of acute Melanie [name] injury.  #Acute diverticulitis of ascending and lesser extent to descending colon causing nausea vomiting and diarrhea: Cdiff negative.  The diarrhea has worsened overnight.  CMV IgM antibody negative.  Currently on Flagyl and ceftriaxone and supportive care per primary team.  May consider GI consult.   #Acute Melanie [name] injury likely hemodynamically mediated in the setting of dehydration concomitant with urinary tract infection.  CT scan ruled out hydronephrosis of transplanted Melanie [name].  UA with urinary tract infection therefore on antibiotics.   The creatinine level slightly worsened today probably because of increased diarrhea overnight.  I am increasing IV fluid rate today.  Continue sodium bicarbonate and LR.  Strict ins and outs, daily lab and monitor renal recovery.  I will order bladder scan.   #Melanie [name] transplant on chronic immunosuppression: DDKT in 2013 at [facility].  Continue Prograf and prednisone.  Continue to hold Myfortic because of GI issues for few days.  Prograf level sent on admission twice but unknown if it was true 12 hours trough.  #E. coli urinary tract infection: On ceftriaxone.   #Non-anion gap metabolic acidosis due to GI loss: Continue IV sodium bicarbonate.  Monitor lab.  Third dose increased.  #Melanie [name]: Blood pressure acceptable.  Continue to monitor.  On nifedipine.   #Hypokalemia: Due to GI loss and increased urine loss with IV fluid/cellular shift.  Repeat lab in the afternoon.  Continue oral potassium chloride.  Monitor lab.  Subjective: Seen and examined.  Patient reported that her diarrhea worsened overnight.  Some nausea but no  vomiting.  Receiving IV fluid.  Urine output is not recorded. Objective Vital signs in last 24 hours: Vitals:   09/19/20 2007 09/19/20 2351 09/20/20 0353 09/20/20 0822  BP: 135/81 124/85 129/88 133/82  Pulse: 75 60 77   Resp: 17 19 18 17   Temp: 98.3 F (36.8 C) 98.5 F (36.9 C) 97.6 F (36.4 C) 98.6 F (37 C)  TempSrc: Oral Oral Oral Oral  SpO2: 99% 100% 100%   Weight:       Weight change:   Intake/Output Summary (Last 24 hours) at 09/20/2020 0952 Last data filed at 09/19/2020 2200 Gross per 24 hour  Intake 944.93 ml  Output --  Net 944.93 ml       Labs: Basic Metabolic Panel: Recent Labs  Lab 09/19/20 0037 09/19/20 0823 09/20/20 0054  NA 138 137 137  K 3.0* 3.6 2.8*  CL 117* 119* 112*  CO2 14* 12* 17*  GLUCOSE 79 97 88  BUN 19 18 15   CREATININE 1.63* 1.63* 2.00*  CALCIUM 8.5* 8.5* 8.4*  PHOS  --  2.1*  --    Liver Function Tests: Recent Labs  Lab 09/18/20 1014 09/19/20 0823  AST 17  --   ALT 44  --   ALKPHOS 81  --   BILITOT 0.5  --   PROT 5.9*  --   ALBUMIN 3.1* 2.5*   Recent Labs  Lab 09/18/20 1014  LIPASE 31   No results for input(s): AMMONIA in the last 168 hours. CBC: Recent Labs  Lab 09/18/20 1014 09/19/20 0037  WBC 4.1 4.1  HGB 12.1 10.4*  HCT 36.2 31.2*  MCV 93.3 93.4  PLT 289  223   Cardiac Enzymes: No results for input(s): CKTOTAL, CKMB, CKMBINDEX, TROPONINI in the last 168 hours. CBG: Recent Labs  Lab 09/19/20 0838 09/19/20 1246  GLUCAP 100* 126*    Iron Studies: No results for input(s): IRON, TIBC, TRANSFERRIN, FERRITIN in the last 72 hours. Studies/Results: CT ABDOMEN PELVIS WO CONTRAST  Result Date: 09/18/2020 CLINICAL DATA:  Abdominal pain, concern for diverticulitis. EXAM: CT ABDOMEN AND PELVIS WITHOUT CONTRAST TECHNIQUE: Multidetector CT imaging of the abdomen and pelvis was performed following the standard protocol without IV contrast. COMPARISON:  MR abdomen dated 07/13/2020 and CT abdomen pelvis dated  06/11/2020. FINDINGS: Lower chest: No acute abnormality. Hepatobiliary: A small cyst is seen in the right hepatic lobe, measuring 4 mm. Status post cholecystectomy. No biliary dilatation. Pancreas: Unremarkable. No pancreatic ductal dilatation or surrounding inflammatory changes. Spleen: Normal in size without focal abnormality. Adrenals/Urinary Tract: Adrenal glands are unremarkable. The native kidneys are atrophic with a few small cysts. No hydronephrosis. A right lower quadrant transplant Melanie [name] is noted with a nonobstructive 6 mm calculus in the lower pole. No hydronephrosis or focal lesion. Bladder is unremarkable. Stomach/Bowel: Stomach is within normal limits. Appendix appears normal. Diverticula are seen in the ascending colon with mild associated fat stranding, most consistent with diverticulitis. There are also diverticula and minimal associated fat stranding involving the descending colon (series 3, images 47-52). No evidence of abscess or fistula formation in either location. No evidence of bowel obstruction. Vascular/Lymphatic: Aortic atherosclerosis. No enlarged abdominal or pelvic lymph nodes. Reproductive: Multiple uterine fibroids are noted. Other: Focal diastasis in the anterior abdominal wall is unchanged. No abdominopelvic ascites. Musculoskeletal: No acute or significant osseous findings. IMPRESSION: Findings most consistent with diverticulitis involving the ascending colon and to a lesser extent the descending colon. No evidence of abscess or fistula formation. Electronically Signed   By: Romona Curls M.D.   On: 09/18/2020 16:01    Medications: Infusions:  cefTRIAXone (ROCEPHIN)  IV 2 g (09/19/20 2320)   lactated ringers 50 mL/hr at 09/19/20 1214   metronidazole 500 mg (09/20/20 0904)    sodium bicarbonate (isotonic) infusion in sterile water 100 mL/hr at 09/20/20 0048   sodium chloride      Scheduled Medications:  famotidine  20 mg Oral Daily   feeding supplement  237 mL Oral TID  BM   ferrous sulfate  325 mg Oral Q breakfast   heparin  5,000 Units Subcutaneous Q8H   metoprolol tartrate  100 mg Oral BID   multivitamin with minerals  1 tablet Oral Daily   NIFEdipine  60 mg Oral BID   potassium chloride  40 mEq Oral TID   predniSONE  5 mg Oral Q breakfast   saccharomyces boulardii  250 mg Oral BID   simvastatin  20 mg Oral QHS   sodium chloride flush  3 mL Intravenous Q12H   tacrolimus  3 mg Oral q AM   tacrolimus  4 mg Oral QHS    have reviewed scheduled and prn medications.  Physical Exam: General:NAD, comfortable Heart:RRR, s1s2 nl Lungs:clear b/l, no crackle Abdomen:soft, Non-tender, non-distended, no allograft tenderness Extremities:No edema Neurology: Alert awake and following commands  Yamileth Hayse Jaynie Collins 09/20/2020,9:52 AM  LOS: 2 days

## 2020-09-20 NOTE — Progress Notes (Signed)
Pt refusing heparin. MD has been made aware.

## 2020-09-21 LAB — CBC WITH DIFFERENTIAL/PLATELET
Abs Immature Granulocytes: 0.02 10*3/uL (ref 0.00–0.07)
Basophils Absolute: 0 10*3/uL (ref 0.0–0.1)
Basophils Relative: 0 %
Eosinophils Absolute: 0 10*3/uL (ref 0.0–0.5)
Eosinophils Relative: 1 %
HCT: 29.2 % — ABNORMAL LOW (ref 36.0–46.0)
Hemoglobin: 9.8 g/dL — ABNORMAL LOW (ref 12.0–15.0)
Immature Granulocytes: 1 %
Lymphocytes Relative: 32 %
Lymphs Abs: 1 10*3/uL (ref 0.7–4.0)
MCH: 30.5 pg (ref 26.0–34.0)
MCHC: 33.6 g/dL (ref 30.0–36.0)
MCV: 91 fL (ref 80.0–100.0)
Monocytes Absolute: 0.6 10*3/uL (ref 0.1–1.0)
Monocytes Relative: 18 %
Neutro Abs: 1.4 10*3/uL — ABNORMAL LOW (ref 1.7–7.7)
Neutrophils Relative %: 48 %
Platelets: 213 10*3/uL (ref 150–400)
RBC: 3.21 MIL/uL — ABNORMAL LOW (ref 3.87–5.11)
RDW: 12.1 % (ref 11.5–15.5)
WBC: 3 10*3/uL — ABNORMAL LOW (ref 4.0–10.5)
nRBC: 0 % (ref 0.0–0.2)

## 2020-09-21 LAB — BASIC METABOLIC PANEL
Anion gap: 8 (ref 5–15)
BUN: 11 mg/dL (ref 6–20)
CO2: 20 mmol/L — ABNORMAL LOW (ref 22–32)
Calcium: 7.5 mg/dL — ABNORMAL LOW (ref 8.9–10.3)
Chloride: 111 mmol/L (ref 98–111)
Creatinine, Ser: 1.87 mg/dL — ABNORMAL HIGH (ref 0.44–1.00)
GFR, Estimated: 32 mL/min — ABNORMAL LOW (ref >60)
Glucose, Bld: 84 mg/dL (ref 70–99)
Potassium: 3.7 mmol/L (ref 3.5–5.1)
Sodium: 139 mmol/L (ref 135–145)

## 2020-09-21 MED ORDER — METOPROLOL TARTRATE 50 MG PO TABS
50.0000 mg | ORAL_TABLET | Freq: Two times a day (BID) | ORAL | Status: DC
  Administered 2020-09-22 – 2020-09-25 (×7): 50 mg via ORAL
  Filled 2020-09-21 (×7): qty 1

## 2020-09-21 MED ORDER — NIFEDIPINE ER OSMOTIC RELEASE 30 MG PO TB24
30.0000 mg | ORAL_TABLET | Freq: Two times a day (BID) | ORAL | Status: DC
  Administered 2020-09-22 – 2020-09-25 (×7): 30 mg via ORAL
  Filled 2020-09-21 (×8): qty 1

## 2020-09-21 MED ORDER — POTASSIUM CHLORIDE CRYS ER 20 MEQ PO TBCR
40.0000 meq | EXTENDED_RELEASE_TABLET | Freq: Once | ORAL | Status: AC
  Administered 2020-09-21: 40 meq via ORAL
  Filled 2020-09-21: qty 2

## 2020-09-21 NOTE — Progress Notes (Signed)
[facility] NEPHROLOGY PROGRESS NOTE  Assessment/ Plan: Pt is a 55 y.o. yo female  with history of hypertension, ESRD status post DDRT in 2013 at [place] follows with Dr. Elvis Coil at [facility], admitted with acute diverticulitis seen as a consultation for the management of acute kidney injury.  #Acute diverticulitis of ascending and lesser extent to descending colon causing nausea vomiting and diarrhea: Cdiff negative.  The stool test is positive for norovirus.  CMV IgM antibody negative.  Currently on Flagyl and ceftriaxone and supportive care per primary team.  Since the diarrhea has been going on for last few months and the patient is not clinically better, I recommend GI consult for their expertise.   #Acute kidney injury likely hemodynamically mediated in the setting of dehydration/diarrhea concomitant with urinary tract infection.  CT scan ruled out hydronephrosis of transplanted kidney.  UA with urinary tract infection therefore on antibiotics.   The creatinine level is downtrending.  I will continue current IV fluid especially since her diarrhea is not better. Strict ins and outs, daily lab and monitor renal recovery.     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  Continue Prograf and prednisone.  Continue to hold Myfortic because of GI issues and has leukopenia today.  Prograf level sent on admission twice but unknown if it was true 12 hours trough, pending the lab result.  #E. coli urinary tract infection: On ceftriaxone.   #Non-anion gap metabolic acidosis due to GI loss: Continue IV sodium bicarbonate.  Monitor lab.    #Hypertension: Blood pressure acceptable.  Currently on nifedipine and metoprolol.  Continue to monitor.     #Hypokalemia: Due to GI loss and increased urine loss with IV fluid/cellular shift.  Continue oral potassium chloride.  Monitor lab.  Subjective: Seen and examined.  Unable to sleep  last night because of continuous diarrhea.  Feels tired.  She denies nausea vomiting chest pain, shortness of breath.  Urine output is not accurately measured. Objective Vital signs in last 24 hours: Vitals:   09/20/20 2113 09/20/20 2344 09/21/20 0340 09/21/20 0756  BP: 132/89 132/83 118/76 118/80  Pulse: 74 72 71 71  Resp:  18 17 20   Temp:  98.6 F (37 C) 98.3 F (36.8 C) 98.4 F (36.9 C)  TempSrc:  Oral Oral Oral  SpO2:  99% 94% 95%  Weight:       Weight change:   Intake/Output Summary (Last 24 hours) at 09/21/2020 1026 Last data filed at 09/21/2020 0935 Gross per 24 hour  Intake 2421.35 ml  Output --  Net 2421.35 ml        Labs: Basic Metabolic Panel: Recent Labs  Lab 09/19/20 0823 09/20/20 0054 09/20/20 1802 09/21/20 0144  NA 137 137 138 139  K 3.6 2.8* 3.2* 3.7  CL 119* 112* 109 111  CO2 12* 17* 20* 20*  GLUCOSE 97 88 96 84  BUN 18 15 12 11   CREATININE 1.63* 2.00* 1.96* 1.87*  CALCIUM 8.5* 8.4* 7.6* 7.5*  PHOS 2.1*  --   --   --     Liver Function Tests: Recent Labs  Lab 09/18/20 1014 09/19/20 0823  AST 17  --   ALT 44  --   ALKPHOS 81  --   BILITOT 0.5  --   PROT 5.9*  --   ALBUMIN 3.1* 2.5*    Recent Labs  Lab 09/18/20 1014  LIPASE 31    No results for input(s): AMMONIA  in the last 168 hours. CBC: Recent Labs  Lab 09/18/20 1014 09/19/20 0037 09/21/20 0144  WBC 4.1 4.1 3.0*  NEUTROABS  --   --  1.4*  HGB 12.1 10.4* 9.8*  HCT 36.2 31.2* 29.2*  MCV 93.3 93.4 91.0  PLT 289 223 213    Cardiac Enzymes: No results for input(s): CKTOTAL, CKMB, CKMBINDEX, TROPONINI in the last 168 hours. CBG: Recent Labs  Lab 09/19/20 0838 09/19/20 1246  GLUCAP 100* 126*     Iron Studies: No results for input(s): IRON, TIBC, TRANSFERRIN, FERRITIN in the last 72 hours. Studies/Results: No results found.  Medications: Infusions:  cefTRIAXone (ROCEPHIN)  IV Stopped (09/21/20 0701)   metronidazole Stopped (09/21/20 0935)    sodium bicarbonate  (isotonic) infusion in sterile water 125 mL/hr at 09/21/20 4696    Scheduled Medications:  famotidine  20 mg Oral Daily   ferrous sulfate  325 mg Oral Q breakfast   heparin  5,000 Units Subcutaneous Q8H   metoprolol tartrate  100 mg Oral BID   multivitamin with minerals  1 tablet Oral Daily   NIFEdipine  60 mg Oral BID   predniSONE  5 mg Oral Q breakfast   psyllium  1 packet Oral Daily   simvastatin  20 mg Oral QHS   sodium chloride flush  3 mL Intravenous Q12H   tacrolimus  3 mg Oral q AM   tacrolimus  4 mg Oral QHS    have reviewed scheduled and prn medications.  Physical Exam: General:NAD, lying on bed comfortable Heart:RRR, s1s2 nl Lungs: Clear b/l, no crackle Abdomen:soft, Non-tender, non-distended, no allograft tenderness Extremities:No LE edema Neurology: Alert awake and following commands  Rashard Ryle Jaynie Collins 09/21/2020,10:26 AM  LOS: 3 days

## 2020-09-21 NOTE — Progress Notes (Signed)
Patient refused both administrations of heparin subcutaneous injections.

## 2020-09-21 NOTE — Progress Notes (Signed)
[facility] PROGRESS NOTE    Progress Note  Melanie [name]  ZOX:[id number] DOB: 30-Jan-1966 DOA: 09/18/2020 PCP: Patient, No Pcp Per (Inactive)     Brief Narrative:   Melanie [name] is an 55 y.o. female past medical history kidney transplant on the right on chronic immunosuppressive therapy since 2013, who comes in complaining of diarrhea and abdominal pain.  She relates she has been follow-up by Dr. Janee MornHan as an outpatient and she was having 3 bowel movements a day after her episode of pancreatitis on March 2022 but her bowel movements have increased to 6-7 nonbloody.  He was placed on Imodium which helped.  Recently she started having nausea with poor appetite and about a 16 pound weight loss.  CT scan of the abdomen and pelvis was done in the ED that showed diverticulitis involving the ascending colon.  Assessment/Plan:   Acute  Diverticulitis: C. difficile PCR negative Gi panel + for Norovirus. Cont. Rocephin and Flagyl along with Florastor twice a day. Cont to have regular BM, start metamucil.  Possible acute urinary tract infection: Currently on Rocephin should help with urinary tract infection.  AKI (acute kidney injury) (HCC) Back in March 2022 her creatinine was around 1, in the setting of nausea vomiting and GI losses. Improved with IV fluids, cont IV fluids. Recheck in am.  Non-anion gap metabolic acidosis: Per renal IV fluids.  Essential hypertension: Continue current regimen blood pressure seems to be at goal.    Unintentional weight loss: About 16 pounds since March will need to follow-up with GI as an outpatient. She will need a colonoscopy. TSH 1.1.  History of hypokalemia: Continue potassium supplementation twice a day potassium seems to be unremarkable.  Normocytic anemia: She relates no melanotic stools or bright red blood per rectum, she will need a colonoscopy as an outpatient. Continue to monitor hemoglobin.    DVT prophylaxis: lovenox Family  Communication:none Status is: Inpatient  Remains inpatient appropriate because:Hemodynamically unstable  Dispo: The patient is from: Home              Anticipated d/c is to: Home              Patient currently is not medically stable to d/c.   Difficult to place patient No    Code Status:     Code Status Orders  (From admission, onward)           Start     Ordered   09/18/20 1507  Full code  Continuous        09/18/20 1509           Code Status History     This patient has a current code status but no historical code status.         IV Access:   Peripheral IV   Procedures and diagnostic studies:   No results found.   Medical Consultants:   None.   Subjective:    Melanie [name] diarrhea unchanged  Objective:    Vitals:   09/20/20 2113 09/20/20 2344 09/21/20 0340 09/21/20 0756  BP: 132/89 132/83 118/76 118/80  Pulse: 74 72 71 71  Resp:  18 17 20   Temp:  98.6 F (37 C) 98.3 F (36.8 C) 98.4 F (36.9 C)  TempSrc:  Oral Oral Oral  SpO2:  99% 94% 95%  Weight:       SpO2: 95 %   Intake/Output Summary (Last 24 hours) at 09/21/2020 1046 Last data filed at 09/21/2020 0935 Gross per 24  hour  Intake 2421.35 ml  Output --  Net 2421.35 ml    Filed Weights   09/19/20 0509  Weight: 77 kg    Exam: General exam: In no acute distress. Respiratory system: Good air movement and clear to auscultation. Cardiovascular system: S1 & S2 heard, RRR. No JVD. Gastrointestinal system: Abdomen is nondistended, soft and nontender.  Extremities: No pedal edema. Skin: No rashes, lesions or ulcers Psychiatry: Judgement and insight appear normal. Mood & affect appropriate.   Data Reviewed:    Labs: Basic Metabolic Panel: Recent Labs  Lab 09/19/20 0037 09/19/20 0823 09/20/20 0054 09/20/20 1802 09/21/20 0144  NA 138 137 137 138 139  K 3.0* 3.6 2.8* 3.2* 3.7  CL 117* 119* 112* 109 111  CO2 14* 12* 17* 20* 20*  GLUCOSE 79 97 88 96 84  BUN 19 18  15 12 11   CREATININE 1.63* 1.63* 2.00* 1.96* 1.87*  CALCIUM 8.5* 8.5* 8.4* 7.6* 7.5*  PHOS  --  2.1*  --   --   --     GFR CrCl cannot be calculated (Unknown ideal weight.). Liver Function Tests: Recent Labs  Lab 09/18/20 1014 09/19/20 0823  AST 17  --   ALT 44  --   ALKPHOS 81  --   BILITOT 0.5  --   PROT 5.9*  --   ALBUMIN 3.1* 2.5*    Recent Labs  Lab 09/18/20 1014  LIPASE 31    No results for input(s): AMMONIA in the last 168 hours. Coagulation profile No results for input(s): INR, PROTIME in the last 168 hours. COVID-19 Labs  No results for input(s): DDIMER, FERRITIN, LDH, CRP in the last 72 hours.  Lab Results  Component Value Date   SARSCOV2NAA NEGATIVE 09/18/2020    CBC: Recent Labs  Lab 09/18/20 1014 09/19/20 0037 09/21/20 0144  WBC 4.1 4.1 3.0*  NEUTROABS  --   --  1.4*  HGB 12.1 10.4* 9.8*  HCT 36.2 31.2* 29.2*  MCV 93.3 93.4 91.0  PLT 289 223 213    Cardiac Enzymes: No results for input(s): CKTOTAL, CKMB, CKMBINDEX, TROPONINI in the last 168 hours. BNP (last 3 results) No results for input(s): PROBNP in the last 8760 hours. CBG: Recent Labs  Lab 09/19/20 0838 09/19/20 1246  GLUCAP 100* 126*    D-Dimer: No results for input(s): DDIMER in the last 72 hours. Hgb A1c: No results for input(s): HGBA1C in the last 72 hours. Lipid Profile: No results for input(s): CHOL, HDL, LDLCALC, TRIG, CHOLHDL, LDLDIRECT in the last 72 hours. Thyroid function studies: Recent Labs    09/18/20 2252  TSH 1.164    Anemia work up: No results for input(s): VITAMINB12, FOLATE, FERRITIN, TIBC, IRON, RETICCTPCT in the last 72 hours. Sepsis Labs: Recent Labs  Lab 09/18/20 1014 09/19/20 0037 09/21/20 0144  WBC 4.1 4.1 3.0*    Microbiology Recent Results (from the past 240 hour(s))  SARS CORONAVIRUS 2 (TAT 6-24 HRS)     Status: None   Collection Time: 09/18/20  1:10 PM  Result Value Ref Range Status   SARS Coronavirus 2 NEGATIVE NEGATIVE Final     Comment: (NOTE) SARS-CoV-2 target nucleic acids are NOT DETECTED.  The SARS-CoV-2 RNA is generally detectable in upper and lower respiratory specimens during the acute phase of infection. Negative results do not preclude SARS-CoV-2 infection, do not rule out co-infections with other pathogens, and should not be used as the sole basis for treatment or other patient management decisions. Negative results must  be combined with clinical observations, patient history, and epidemiological information. The expected result is Negative.  Fact Sheet for Patients: HairSlick.no  Fact Sheet for Healthcare Providers: quierodirigir.com  This test is not yet approved or cleared by the [place] FDA and  has been authorized for detection and/or diagnosis of SARS-CoV-2 by FDA under an Emergency Use Authorization (EUA). This EUA will remain  in effect (meaning this test can be used) for the duration of the COVID-19 declaration under Se ction 564(b)(1) of the Act, 21 U.S.C. section 360bbb-3(b)(1), unless the authorization is terminated or revoked sooner.  Performed at [facility], [address] [place], [place] [address]   Gastrointestinal Panel by PCR , Stool     Status: Abnormal   Collection Time: 09/18/20  2:50 PM   Specimen: STOOL  Result Value Ref Range Status   Campylobacter species NOT DETECTED NOT DETECTED Final   Plesimonas shigelloides NOT DETECTED NOT DETECTED Final   Salmonella species NOT DETECTED NOT DETECTED Final   Yersinia enterocolitica NOT DETECTED NOT DETECTED Final   Vibrio species NOT DETECTED NOT DETECTED Final   Vibrio cholerae NOT DETECTED NOT DETECTED Final   Enteroaggregative E coli (EAEC) NOT DETECTED NOT DETECTED Final   Enteropathogenic E coli (EPEC) NOT DETECTED NOT DETECTED Final   Enterotoxigenic E coli (ETEC) NOT DETECTED NOT DETECTED Final   Shiga like toxin producing E coli (STEC) NOT  DETECTED NOT DETECTED Final   Shigella/Enteroinvasive E coli (EIEC) NOT DETECTED NOT DETECTED Final   Cryptosporidium NOT DETECTED NOT DETECTED Final   Cyclospora cayetanensis NOT DETECTED NOT DETECTED Final   Entamoeba histolytica NOT DETECTED NOT DETECTED Final   Giardia lamblia NOT DETECTED NOT DETECTED Final   Adenovirus F40/41 NOT DETECTED NOT DETECTED Final   Astrovirus NOT DETECTED NOT DETECTED Final   Norovirus GI/GII DETECTED (A) NOT DETECTED Final    Comment: RESULT CALLED TO, READ BACK BY AND VERIFIED WITH: DESIRAY GRIFFITH AT 1448 09/20/20.PMF    Rotavirus A NOT DETECTED NOT DETECTED Final   Sapovirus (I, II, IV, and V) NOT DETECTED NOT DETECTED Final    Comment: Performed at [facility], [address] [place], [place] [address]  C Difficile Quick Screen w PCR reflex     Status: None   Collection Time: 09/18/20  2:50 PM   Specimen: STOOL  Result Value Ref Range Status   C Diff antigen NEGATIVE NEGATIVE Final   C Diff toxin NEGATIVE NEGATIVE Final   C Diff interpretation No C. difficile detected.  Final    Comment: Performed at [facility], [address] [place], [place] [address]     Medications:    famotidine  20 mg Oral Daily   ferrous sulfate  325 mg Oral Q breakfast   heparin  5,000 Units Subcutaneous Q8H   metoprolol tartrate  100 mg Oral BID   multivitamin with minerals  1 tablet Oral Daily   NIFEdipine  60 mg Oral BID   potassium chloride  40 mEq Oral Once   predniSONE  5 mg Oral Q breakfast   psyllium  1 packet Oral Daily   simvastatin  20 mg Oral QHS   sodium chloride flush  3 mL Intravenous Q12H   tacrolimus  3 mg Oral q AM   tacrolimus  4 mg Oral QHS   Continuous Infusions:  cefTRIAXone (ROCEPHIN)  IV Stopped (09/21/20 0701)   metronidazole Stopped (09/21/20 0935)    sodium bicarbonate (isotonic) infusion in sterile  water 125 mL/hr at 09/21/20 0837      LOS: 3 days   Marinda Elk  [facility]  09/21/2020,  10:46 AM

## 2020-09-22 LAB — CBC WITH DIFFERENTIAL/PLATELET
Abs Immature Granulocytes: 0.03 10*3/uL (ref 0.00–0.07)
Basophils Absolute: 0 10*3/uL (ref 0.0–0.1)
Basophils Relative: 0 %
Eosinophils Absolute: 0.1 10*3/uL (ref 0.0–0.5)
Eosinophils Relative: 2 %
HCT: 26.8 % — ABNORMAL LOW (ref 36.0–46.0)
Hemoglobin: 9.1 g/dL — ABNORMAL LOW (ref 12.0–15.0)
Immature Granulocytes: 1 %
Lymphocytes Relative: 35 %
Lymphs Abs: 1.1 10*3/uL (ref 0.7–4.0)
MCH: 31.4 pg (ref 26.0–34.0)
MCHC: 34 g/dL (ref 30.0–36.0)
MCV: 92.4 fL (ref 80.0–100.0)
Monocytes Absolute: 0.5 10*3/uL (ref 0.1–1.0)
Monocytes Relative: 17 %
Neutro Abs: 1.4 10*3/uL — ABNORMAL LOW (ref 1.7–7.7)
Neutrophils Relative %: 45 %
Platelets: 194 10*3/uL (ref 150–400)
RBC: 2.9 MIL/uL — ABNORMAL LOW (ref 3.87–5.11)
RDW: 12.1 % (ref 11.5–15.5)
WBC: 3.1 10*3/uL — ABNORMAL LOW (ref 4.0–10.5)
nRBC: 0 % (ref 0.0–0.2)

## 2020-09-22 LAB — BASIC METABOLIC PANEL
Anion gap: 5 (ref 5–15)
BUN: 6 mg/dL (ref 6–20)
CO2: 33 mmol/L — ABNORMAL HIGH (ref 22–32)
Calcium: 7 mg/dL — ABNORMAL LOW (ref 8.9–10.3)
Chloride: 101 mmol/L (ref 98–111)
Creatinine, Ser: 1.38 mg/dL — ABNORMAL HIGH (ref 0.44–1.00)
GFR, Estimated: 46 mL/min — ABNORMAL LOW (ref >60)
Glucose, Bld: 88 mg/dL (ref 70–99)
Potassium: 3.5 mmol/L (ref 3.5–5.1)
Sodium: 139 mmol/L (ref 135–145)

## 2020-09-22 MED ORDER — LOPERAMIDE HCL 2 MG PO CAPS: 2.0000 mg | ORAL_CAPSULE | ORAL | Status: DC | PRN

## 2020-09-22 MED ORDER — LOPERAMIDE HCL 2 MG PO CAPS
2.0000 mg | ORAL_CAPSULE | ORAL | Status: AC | PRN
  Administered 2020-09-22 (×2): 2 mg via ORAL
  Filled 2020-09-22 (×2): qty 1

## 2020-09-22 MED ORDER — SODIUM CHLORIDE 0.9 % IV SOLN
INTRAVENOUS | Status: DC | PRN
  Administered 2020-09-22: 250 mL via INTRAVENOUS

## 2020-09-22 MED ORDER — SACCHAROMYCES BOULARDII 250 MG PO CAPS
250.0000 mg | ORAL_CAPSULE | Freq: Two times a day (BID) | ORAL | Status: DC
  Administered 2020-09-22 – 2020-09-25 (×7): 250 mg via ORAL
  Filled 2020-09-22 (×7): qty 1

## 2020-09-22 MED ORDER — ALUM & MAG HYDROXIDE-SIMETH 200-200-20 MG/5ML PO SUSP
30.0000 mL | ORAL | Status: DC | PRN
  Administered 2020-09-22: 30 mL via ORAL
  Filled 2020-09-22: qty 30

## 2020-09-22 NOTE — Progress Notes (Signed)
[facility] PROGRESS NOTE    Progress Note  Melanie [name]  JYN:[id number] DOB: [birth year] DOA: 09/18/2020 PCP: Patient, No Pcp Per (Inactive)     Brief Narrative:   Melanie [name] is an 55 y.o. female past medical history kidney transplant on the right on chronic immunosuppressive therapy since 2013, who comes in complaining of diarrhea and abdominal pain.  She relates she has been follow-up by Dr. Janee MornHan as an outpatient and she was having 3 bowel movements a day after her episode of pancreatitis on March 2022 but her bowel movements have increased to 6-7 nonbloody.  He was placed on Imodium which helped.  Recently she started having nausea with poor appetite and about a 16 pound weight loss.  CT scan of the abdomen and pelvis was done in the ED that showed diverticulitis involving the ascending colon.  Assessment/Plan:   Acute  watery diarrhea: Likely due to Norovirus. Cont. Flagyl along with Florastor and Metamucil twice a day. She relates she continues to have watery diarrhea more than 7 bowel movements a day. We will go ahead and start her on loperamide.  Possible acute urinary tract infection: Currently on Rocephin should help with urinary tract infection.  AKI (acute kidney injury) (HCC) Back in March 2022 her creatinine was around 1, in the setting of nausea vomiting and GI losses. Cont IV fluids. Cr is improving.  Non-anion gap metabolic acidosis: Per renal IV fluids.  Essential hypertension: Continue current regimen blood pressure seems to be at goal.    Unintentional weight loss: About 16 pounds since March will need to follow-up with GI as an outpatient. She will need a colonoscopy. TSH 1.1.  History of hypokalemia: Continue potassium supplementation twice a day potassium seems to be unremarkable.  Normocytic anemia: She relates no melanotic stools or bright red blood per rectum, she will need a colonoscopy as an outpatient. Continue to monitor  hemoglobin.    DVT prophylaxis: lovenox Family Communication:none Status is: Inpatient  Remains inpatient appropriate because:Hemodynamically unstable  Dispo: The patient is from: Home              Anticipated d/c is to: Home              Patient currently is not medically stable to d/c.   Difficult to place patient No    Code Status:     Code Status Orders  (From admission, onward)           Start     Ordered   09/18/20 1507  Full code  Continuous        09/18/20 1509           Code Status History     This patient has a current code status but no historical code status.         IV Access:   Peripheral IV   Procedures and diagnostic studies:   No results found.   Medical Consultants:   None.   Subjective:    Melanie PapLora [name] cont to have watery diarhea 7-10 BM,   Objective:    Vitals:   09/21/20 2233 09/22/20 0001 09/22/20 0403 09/22/20 0726  BP: 110/82 121/78 123/82 130/86  Pulse:  67 63 68  Resp:  14 16 17   Temp:  98.9 F (37.2 C) 98 F (36.7 C) 98.4 F (36.9 C)  TempSrc:  Oral Oral Oral  SpO2:  98% 97% 96%  Weight:      Height:       SpO2:  96 %   Intake/Output Summary (Last 24 hours) at 09/22/2020 0940 Last data filed at 09/22/2020 0743 Gross per 24 hour  Intake 3231.08 ml  Output 600 ml  Net 2631.08 ml    Filed Weights   09/19/20 0509  Weight: 77 kg    Exam: General exam: In no acute distress. Gastrointestinal system: Abdomen is nondistended, soft and nontender.  Extremities: No pedal edema. Skin: No rashes, lesions or ulcers Psychiatry: Judgement and insight appear normal. Mood & affect appropriate.   Data Reviewed:    Labs: Basic Metabolic Panel: Recent Labs  Lab 09/19/20 0823 09/20/20 0054 09/20/20 1802 09/21/20 0144 09/22/20 0115  NA 137 137 138 139 139  K 3.6 2.8* 3.2* 3.7 3.5  CL 119* 112* 109 111 101  CO2 12* 17* 20* 20* 33*  GLUCOSE 97 88 96 84 88  BUN 18 15 12 11 6   CREATININE 1.63* 2.00*  1.96* 1.87* 1.38*  CALCIUM 8.5* 8.4* 7.6* 7.5* 7.0*  PHOS 2.1*  --   --   --   --     GFR Estimated Creatinine Clearance: 47.3 mL/min (A) (by C-G formula based on SCr of 1.38 mg/dL (H)). Liver Function Tests: Recent Labs  Lab 09/18/20 1014 09/19/20 0823  AST 17  --   ALT 44  --   ALKPHOS 81  --   BILITOT 0.5  --   PROT 5.9*  --   ALBUMIN 3.1* 2.5*    Recent Labs  Lab 09/18/20 1014  LIPASE 31    No results for input(s): AMMONIA in the last 168 hours. Coagulation profile No results for input(s): INR, PROTIME in the last 168 hours. COVID-19 Labs  No results for input(s): DDIMER, FERRITIN, LDH, CRP in the last 72 hours.  Lab Results  Component Value Date   SARSCOV2NAA NEGATIVE 09/18/2020    CBC: Recent Labs  Lab 09/18/20 1014 09/19/20 0037 09/21/20 0144 09/22/20 0115  WBC 4.1 4.1 3.0* 3.1*  NEUTROABS  --   --  1.4* 1.4*  HGB 12.1 10.4* 9.8* 9.1*  HCT 36.2 31.2* 29.2* 26.8*  MCV 93.3 93.4 91.0 92.4  PLT 289 223 213 194    Cardiac Enzymes: No results for input(s): CKTOTAL, CKMB, CKMBINDEX, TROPONINI in the last 168 hours. BNP (last 3 results) No results for input(s): PROBNP in the last 8760 hours. CBG: Recent Labs  Lab 09/19/20 0838 09/19/20 1246  GLUCAP 100* 126*    D-Dimer: No results for input(s): DDIMER in the last 72 hours. Hgb A1c: No results for input(s): HGBA1C in the last 72 hours. Lipid Profile: No results for input(s): CHOL, HDL, LDLCALC, TRIG, CHOLHDL, LDLDIRECT in the last 72 hours. Thyroid function studies: No results for input(s): TSH, T4TOTAL, T3FREE, THYROIDAB in the last 72 hours.  Invalid input(s): FREET3  Anemia work up: No results for input(s): VITAMINB12, FOLATE, FERRITIN, TIBC, IRON, RETICCTPCT in the last 72 hours. Sepsis Labs: Recent Labs  Lab 09/18/20 1014 09/19/20 0037 09/21/20 0144 09/22/20 0115  WBC 4.1 4.1 3.0* 3.1*    Microbiology Recent Results (from the past 240 hour(s))  SARS CORONAVIRUS 2 (TAT 6-24  HRS)     Status: None   Collection Time: 09/18/20  1:10 PM  Result Value Ref Range Status   SARS Coronavirus 2 NEGATIVE NEGATIVE Final    Comment: (NOTE) SARS-CoV-2 target nucleic acids are NOT DETECTED.  The SARS-CoV-2 RNA is generally detectable in upper and lower respiratory specimens during the acute phase of infection. Negative results do not preclude  SARS-CoV-2 infection, do not rule out co-infections with other pathogens, and should not be used as the sole basis for treatment or other patient management decisions. Negative results must be combined with clinical observations, patient history, and epidemiological information. The expected result is Negative.  Fact Sheet for Patients: HairSlick.no  Fact Sheet for Healthcare Providers: quierodirigir.com  This test is not yet approved or cleared by the [place] FDA and  has been authorized for detection and/or diagnosis of SARS-CoV-2 by FDA under an Emergency Use Authorization (EUA). This EUA will remain  in effect (meaning this test can be used) for the duration of the COVID-19 declaration under Se ction 564(b)(1) of the Act, 21 U.S.C. section 360bbb-3(b)(1), unless the authorization is terminated or revoked sooner.  Performed at [facility], [address], [place], [place] [address]   Gastrointestinal Panel by PCR , Stool     Status: Abnormal   Collection Time: 09/18/20  2:50 PM   Specimen: STOOL  Result Value Ref Range Status   Campylobacter species NOT DETECTED NOT DETECTED Final   Plesimonas shigelloides NOT DETECTED NOT DETECTED Final   Salmonella species NOT DETECTED NOT DETECTED Final   Yersinia enterocolitica NOT DETECTED NOT DETECTED Final   Vibrio species NOT DETECTED NOT DETECTED Final   Vibrio cholerae NOT DETECTED NOT DETECTED Final   Enteroaggregative E coli (EAEC) NOT DETECTED NOT DETECTED Final   Enteropathogenic E coli (EPEC) NOT DETECTED  NOT DETECTED Final   Enterotoxigenic E coli (ETEC) NOT DETECTED NOT DETECTED Final   Shiga like toxin producing E coli (STEC) NOT DETECTED NOT DETECTED Final   Shigella/Enteroinvasive E coli (EIEC) NOT DETECTED NOT DETECTED Final   Cryptosporidium NOT DETECTED NOT DETECTED Final   Cyclospora cayetanensis NOT DETECTED NOT DETECTED Final   Entamoeba histolytica NOT DETECTED NOT DETECTED Final   Giardia lamblia NOT DETECTED NOT DETECTED Final   Adenovirus F40/41 NOT DETECTED NOT DETECTED Final   Astrovirus NOT DETECTED NOT DETECTED Final   Norovirus GI/GII DETECTED (A) NOT DETECTED Final    Comment: RESULT CALLED TO, READ BACK BY AND VERIFIED WITH: DESIRAY GRIFFITH AT 1448 09/20/20.PMF    Rotavirus A NOT DETECTED NOT DETECTED Final   Sapovirus (I, II, IV, and V) NOT DETECTED NOT DETECTED Final    Comment: Performed at [facility], [address] [place], [place] [address]  C Difficile Quick Screen w PCR reflex     Status: None   Collection Time: 09/18/20  2:50 PM   Specimen: STOOL  Result Value Ref Range Status   C Diff antigen NEGATIVE NEGATIVE Final   C Diff toxin NEGATIVE NEGATIVE Final   C Diff interpretation No C. difficile detected.  Final    Comment: Performed at [facility], [address] [place], [place] [address]     Medications:    famotidine  20 mg Oral Daily   ferrous sulfate  325 mg Oral Q breakfast   heparin  5,000 Units Subcutaneous Q8H   metoprolol tartrate  50 mg Oral BID   multivitamin with minerals  1 tablet Oral Daily   NIFEdipine  30 mg Oral BID   predniSONE  5 mg Oral Q breakfast   psyllium  1 packet Oral Daily   simvastatin  20 mg Oral QHS   sodium chloride flush  3 mL Intravenous Q12H   tacrolimus  3 mg Oral q AM   tacrolimus  4 mg Oral QHS   Continuous Infusions:  sodium chloride  Stopped (09/22/20 4742)   cefTRIAXone (ROCEPHIN)  IV Stopped (09/22/20 0109)   metronidazole 500 mg (09/22/20 0840)      LOS: 4 days   Marinda Elk  [facility]  09/22/2020, 9:40 AM

## 2020-09-22 NOTE — Progress Notes (Signed)
Melanie [name]  Assessment/ Plan: Pt is a 55 y.o. yo female  with history of hypertension, ESRD status post DDRT in 2013 at [place] follows with Dr. Elvis Coil at [facility], admitted with acute diverticulitis seen as a consultation for the management of acute kidney injury.  #Acute diverticulitis of ascending and lesser extent to descending colon causing nausea vomiting and diarrhea: Cdiff negative.  The stool test is positive for norovirus.  CMV IgM antibody negative.  Currently on Flagyl and ceftriaxone and supportive care per primary team.  Since the diarrhea has been going on for last few months and the patient is not clinically better, I recommend GI consult for their expertise. Immunosuppression can likely be delaying her recovery?   #Acute kidney injury likely hemodynamically mediated in the setting of dehydration/diarrhea concomitant with urinary tract infection.  CT scan ruled out hydronephrosis of transplanted kidney.  UA with urinary tract infection therefore on antibiotics.   The creatinine level is downtrending.  Continue current w/ IV fluid especially since her diarrhea is not better. Strict ins and outs, daily lab and monitor renal recovery.     #Kidney transplant on chronic immunosuppression: DDKT in 2013 at [facility].  Continue Prograf and prednisone.  Continue to hold Myfortic because of GI issues and has leukopenia today.  Prograf level sent on admission twice but unknown if it was true 12 hours trough, pending the lab result. Repeat trough ordered for tomorrow AM  #E. coli urinary tract infection: s/p ceftriaxone.   #Non-anion gap metabolic acidosis due to GI loss: stopped iv bicarb, she is now alkalotic  #Hypertension: Blood pressure acceptable.  Currently on nifedipine and metoprolol.  Continue to monitor.     #Hypokalemia: Due to GI loss and increased urine loss with IV fluid/cellular shift.  Continue oral  potassium chloride.  Monitor lab.  Subjective: Seen and examined bedside. Still having diarrhea (watery stool). Stool panel pos for norovirus. Has occasional headaches but nothing out of the ordinary per patient. Denies any abd pain, pain over transplant site, hematuria, dizziness, changes in vision, paraesthesias, tremors, cp, sob. Has some arm swelling from IVF, IVF currently on hold. Has been trying to drink fluids but appetite still poor. Mycophenolate on hold due to diarrhea and leukopenia. Wbc 3.1 today Objective Vital signs in last 24 hours: Vitals:   09/21/20 2233 09/22/20 0001 09/22/20 0403 09/22/20 0726  BP: 110/82 121/78 123/82 130/86  Pulse:  67 63 68  Resp:  14 16 17   Temp:  98.9 F (37.2 C) 98 F (36.7 C) 98.4 F (36.9 C)  TempSrc:  Oral Oral Oral  SpO2:  98% 97% 96%  Weight:      Height:       Weight change:   Intake/Output Summary (Last 24 hours) at 09/22/2020 1103 Last data filed at 09/22/2020 0743 Gross per 24 hour  Intake 3231.08 ml  Output 600 ml  Net 2631.08 ml       Labs: Basic Metabolic Panel: Recent Labs  Lab 09/19/20 0823 09/20/20 0054 09/20/20 1802 09/21/20 0144 09/22/20 0115  NA 137   < > 138 139 139  K 3.6   < > 3.2* 3.7 3.5  CL 119*   < > 109 111 101  CO2 12*   < > 20* 20* 33*  GLUCOSE 97   < > 96 84 88  BUN 18   < > 12 11 6   CREATININE 1.63*   < > 1.96* 1.87* 1.38*  CALCIUM 8.5*   < > 7.6* 7.5* 7.0*  PHOS 2.1*  --   --   --   --    < > = values in this interval not displayed.   Liver Function Tests: Recent Labs  Lab 09/18/20 1014 09/19/20 0823  AST 17  --   ALT 44  --   ALKPHOS 81  --   BILITOT 0.5  --   PROT 5.9*  --   ALBUMIN 3.1* 2.5*   Recent Labs  Lab 09/18/20 1014  LIPASE 31   No results for input(s): AMMONIA in the last 168 hours. CBC: Recent Labs  Lab 09/18/20 1014 09/19/20 0037 09/21/20 0144 09/22/20 0115  WBC 4.1 4.1 3.0* 3.1*  NEUTROABS  --   --  1.4* 1.4*  HGB 12.1 10.4* 9.8* 9.1*  HCT 36.2 31.2*  29.2* 26.8*  MCV 93.3 93.4 91.0 92.4  PLT 289 223 213 194   Cardiac Enzymes: No results for input(s): CKTOTAL, CKMB, CKMBINDEX, TROPONINI in the last 168 hours. CBG: Recent Labs  Lab 09/19/20 0838 09/19/20 1246  GLUCAP 100* 126*    Iron Studies: No results for input(s): IRON, TIBC, TRANSFERRIN, FERRITIN in the last 72 hours. Studies/Results: No results found.  Medications: Infusions:  sodium chloride Stopped (09/22/20 0437)   metronidazole 500 mg (09/22/20 0840)    Scheduled Medications:  famotidine  20 mg Oral Daily   ferrous sulfate  325 mg Oral Q breakfast   heparin  5,000 Units Subcutaneous Q8H   metoprolol tartrate  50 mg Oral BID   multivitamin with minerals  1 tablet Oral Daily   NIFEdipine  30 mg Oral BID   predniSONE  5 mg Oral Q breakfast   psyllium  1 packet Oral Daily   saccharomyces boulardii  250 mg Oral BID   simvastatin  20 mg Oral QHS   sodium chloride flush  3 mL Intravenous Q12H   tacrolimus  3 mg Oral q AM   tacrolimus  4 mg Oral QHS    have reviewed scheduled and prn medications.  Physical Exam: General:NAD, lying on bed comfortable Heart:RRR, s1s2 nl Lungs: Clear b/l, no crackle Abdomen:soft, Non-tender, non-distended, no allograft tenderness Extremities:No LE edema Neurology: Alert awake and following commands  Melanie [name] 09/22/2020,11:03 AM  LOS: 4 days

## 2020-09-22 NOTE — TOC Progression Note (Signed)
Transition of Care [facility]) - Progression Note    Patient Details  Name: Andreina Outten MRN: [id number] Date of Birth: [birth year]  [facility] CM/SW Contact  Pollie Friar, RN Phone Number: 09/22/2020, 12:26 PM  Clinical Narrative:    CM met with the patient and she has no insurance. She is interested in getting set up with one of the [facility]. CM has left message for MOA to see about getting her an appt tomorrow as today is a holiday.  Pt then can also use [facility] for medication assistance.  Pt has no DME at home. She does have supervision.  TOC following.   Expected Discharge Plan: Home/Self Care Barriers to Discharge: Inadequate or no insurance, Continued Medical Work up  Expected Discharge Plan and Services Expected Discharge Plan: Home/Self Care   Discharge Planning Services: CM Consult   Living arrangements for the past 2 months: Single Family Home                                       Social Determinants of Health (SDOH) Interventions    Readmission Risk Interventions No flowsheet data found.

## 2020-09-23 DIAGNOSIS — A0811 Acute gastroenteropathy due to Norwalk agent: Secondary | ICD-10-CM

## 2020-09-23 LAB — CBC WITH DIFFERENTIAL/PLATELET
Abs Immature Granulocytes: 0.02 10*3/uL (ref 0.00–0.07)
Basophils Absolute: 0 10*3/uL (ref 0.0–0.1)
Basophils Relative: 0 %
Eosinophils Absolute: 0.1 10*3/uL (ref 0.0–0.5)
Eosinophils Relative: 3 %
HCT: 29.6 % — ABNORMAL LOW (ref 36.0–46.0)
Hemoglobin: 9.6 g/dL — ABNORMAL LOW (ref 12.0–15.0)
Immature Granulocytes: 1 %
Lymphocytes Relative: 24 %
Lymphs Abs: 0.8 10*3/uL (ref 0.7–4.0)
MCH: 30.9 pg (ref 26.0–34.0)
MCHC: 32.4 g/dL (ref 30.0–36.0)
MCV: 95.2 fL (ref 80.0–100.0)
Monocytes Absolute: 0.5 10*3/uL (ref 0.1–1.0)
Monocytes Relative: 16 %
Neutro Abs: 1.8 10*3/uL (ref 1.7–7.7)
Neutrophils Relative %: 56 %
Platelets: 193 10*3/uL (ref 150–400)
RBC: 3.11 MIL/uL — ABNORMAL LOW (ref 3.87–5.11)
RDW: 12.4 % (ref 11.5–15.5)
WBC: 3.2 10*3/uL — ABNORMAL LOW (ref 4.0–10.5)
nRBC: 0 % (ref 0.0–0.2)

## 2020-09-23 LAB — RENAL FUNCTION PANEL
Albumin: 2.3 g/dL — ABNORMAL LOW (ref 3.5–5.0)
Anion gap: 7 (ref 5–15)
BUN: 6 mg/dL (ref 6–20)
CO2: 30 mmol/L (ref 22–32)
Calcium: 7.4 mg/dL — ABNORMAL LOW (ref 8.9–10.3)
Chloride: 103 mmol/L (ref 98–111)
Creatinine, Ser: 1.84 mg/dL — ABNORMAL HIGH (ref 0.44–1.00)
GFR, Estimated: 32 mL/min — ABNORMAL LOW (ref >60)
Glucose, Bld: 113 mg/dL — ABNORMAL HIGH (ref 70–99)
Phosphorus: 1.6 mg/dL — ABNORMAL LOW (ref 2.5–4.6)
Potassium: 2.8 mmol/L — ABNORMAL LOW (ref 3.5–5.1)
Sodium: 140 mmol/L (ref 135–145)

## 2020-09-23 LAB — TACROLIMUS LEVEL
Tacrolimus (FK506) - LabCorp: 27 ng/mL — ABNORMAL HIGH (ref 2.0–20.0)
Tacrolimus (FK506) - LabCorp: 8 ng/mL (ref 2.0–20.0)

## 2020-09-23 MED ORDER — METRONIDAZOLE 500 MG/100ML IV SOLN
500.0000 mg | Freq: Three times a day (TID) | INTRAVENOUS | Status: AC
  Administered 2020-09-23 – 2020-09-25 (×6): 500 mg via INTRAVENOUS
  Filled 2020-09-23 (×6): qty 100

## 2020-09-23 MED ORDER — SODIUM CHLORIDE 0.9 % IV SOLN: INTRAVENOUS | Status: AC

## 2020-09-23 MED ORDER — LACTATED RINGERS IV SOLN: INTRAVENOUS | Status: DC

## 2020-09-23 MED ORDER — POTASSIUM CHLORIDE CRYS ER 20 MEQ PO TBCR
40.0000 meq | EXTENDED_RELEASE_TABLET | Freq: Once | ORAL | Status: AC
  Administered 2020-09-23: 40 meq via ORAL
  Filled 2020-09-23: qty 2

## 2020-09-23 NOTE — Progress Notes (Signed)
[facility] PROGRESS NOTE    Progress Note  Melanie [name]  ZOX:[id number] DOB: 01-Oct-1965 DOA: 09/18/2020 PCP: Patient, No Pcp Per (Inactive)     Brief Narrative:   Melanie [name] is an 55 y.o. female past medical history kidney transplant on the right on chronic immunosuppressive therapy since 2013, who comes in complaining of diarrhea and abdominal pain.  She relates she has been follow-up by Dr. Janee MornHan as an outpatient and she was having 3 bowel movements a day after her episode of pancreatitis on March 2022 but her bowel movements have increased to 6-7 nonbloody.  He was placed on Imodium which helped.  Recently she started having nausea with poor appetite and about a 16 pound weight loss.  CT scan of the abdomen and pelvis was done in the ED that showed diverticulitis involving the ascending colon.  Assessment/Plan:   Acute  watery diarrhea: Likely due to Norovirus.  Antibiotics were discontinued. Diarrhea now significantly improved she only had 2 bowel movements in the last 24 hours. Basic metabolic panel is pending. She relates she is tolerating her diet.  Possible acute urinary tract infection: Rocephin has been discontinued.  AKI (acute kidney injury) (HCC) Back in March 2022 her creatinine was around 1, in the setting of nausea vomiting and GI losses. Appreciate renal's assistance, basic metabolic panel is pending.  Non-anion gap metabolic acidosis: Per renal IV fluids.  Essential hypertension: Continue current regimen blood pressure seems to be at goal.    Unintentional weight loss: About 16 pounds since March will need to follow-up with GI as an outpatient. She will need a colonoscopy. TSH 1.1.  History of hypokalemia: Continue current home regimen potassium seems to be stable.  Normocytic anemia: She relates no melanotic stools or bright red blood per rectum, she will need a colonoscopy as an outpatient. Continue to monitor hemoglobin.    DVT prophylaxis:  lovenox Family Communication:none Status is: Inpatient  Remains inpatient appropriate because:Hemodynamically unstable  Dispo: The patient is from: Home              Anticipated d/c is to: Home              Patient currently is not medically stable to d/c.   Difficult to place patient No    Code Status:     Code Status Orders  (From admission, onward)           Start     Ordered   09/18/20 1507  Full code  Continuous        09/18/20 1509           Code Status History     This patient has a current code status but no historical code status.         IV Access:   Peripheral IV   Procedures and diagnostic studies:   No results found.   Medical Consultants:   None.   Subjective:    Melanie [name] only 2 bowel movements in 24 hours  Objective:    Vitals:   09/22/20 2152 09/23/20 0124 09/23/20 0530 09/23/20 0732  BP: 129/84 131/84 116/76 122/76  Pulse: 71 70 73 71  Resp: 16 18 20 18   Temp: 99.1 F (37.3 C) 98.9 F (37.2 C) 99 F (37.2 C) 98.7 F (37.1 C)  TempSrc: Oral Oral Oral Oral  SpO2: 97% 98% 93% 93%  Weight:      Height:       SpO2: 93 %   Intake/Output Summary (  Last 24 hours) at 09/23/2020 1013 Last data filed at 09/22/2020 1100 Gross per 24 hour  Intake 240 ml  Output --  Net 240 ml    Filed Weights   09/19/20 0509  Weight: 77 kg    Exam: General exam: In no acute distress. Respiratory system: Good air movement and clear to auscultation. Cardiovascular system: S1 & S2 heard, RRR. No JVD. Gastrointestinal system: Abdomen is nondistended, soft and nontender.  Extremities: No pedal edema. Skin: No rashes, lesions or ulcers   Data Reviewed:    Labs: Basic Metabolic Panel: Recent Labs  Lab 09/19/20 0823 09/20/20 0054 09/20/20 1802 09/21/20 0144 09/22/20 0115  NA 137 137 138 139 139  K 3.6 2.8* 3.2* 3.7 3.5  CL 119* 112* 109 111 101  CO2 12* 17* 20* 20* 33*  GLUCOSE 97 88 96 84 88  BUN 18 15 12 11 6    CREATININE 1.63* 2.00* 1.96* 1.87* 1.38*  CALCIUM 8.5* 8.4* 7.6* 7.5* 7.0*  PHOS 2.1*  --   --   --   --     GFR Estimated Creatinine Clearance: 47.3 mL/min (A) (by C-G formula based on SCr of 1.38 mg/dL (H)). Liver Function Tests: Recent Labs  Lab 09/18/20 1014 09/19/20 0823  AST 17  --   ALT 44  --   ALKPHOS 81  --   BILITOT 0.5  --   PROT 5.9*  --   ALBUMIN 3.1* 2.5*    Recent Labs  Lab 09/18/20 1014  LIPASE 31    No results for input(s): AMMONIA in the last 168 hours. Coagulation profile No results for input(s): INR, PROTIME in the last 168 hours. COVID-19 Labs  No results for input(s): DDIMER, FERRITIN, LDH, CRP in the last 72 hours.  Lab Results  Component Value Date   SARSCOV2NAA NEGATIVE 09/18/2020    CBC: Recent Labs  Lab 09/18/20 1014 09/19/20 0037 09/21/20 0144 09/22/20 0115  WBC 4.1 4.1 3.0* 3.1*  NEUTROABS  --   --  1.4* 1.4*  HGB 12.1 10.4* 9.8* 9.1*  HCT 36.2 31.2* 29.2* 26.8*  MCV 93.3 93.4 91.0 92.4  PLT 289 223 213 194    Cardiac Enzymes: No results for input(s): CKTOTAL, CKMB, CKMBINDEX, TROPONINI in the last 168 hours. BNP (last 3 results) No results for input(s): PROBNP in the last 8760 hours. CBG: Recent Labs  Lab 09/19/20 0838 09/19/20 1246  GLUCAP 100* 126*    D-Dimer: No results for input(s): DDIMER in the last 72 hours. Hgb A1c: No results for input(s): HGBA1C in the last 72 hours. Lipid Profile: No results for input(s): CHOL, HDL, LDLCALC, TRIG, CHOLHDL, LDLDIRECT in the last 72 hours. Thyroid function studies: No results for input(s): TSH, T4TOTAL, T3FREE, THYROIDAB in the last 72 hours.  Invalid input(s): FREET3  Anemia work up: No results for input(s): VITAMINB12, FOLATE, FERRITIN, TIBC, IRON, RETICCTPCT in the last 72 hours. Sepsis Labs: Recent Labs  Lab 09/18/20 1014 09/19/20 0037 09/21/20 0144 09/22/20 0115  WBC 4.1 4.1 3.0* 3.1*    Microbiology Recent Results (from the past 240 hour(s))  SARS  CORONAVIRUS 2 (TAT 6-24 HRS)     Status: None   Collection Time: 09/18/20  1:10 PM  Result Value Ref Range Status   SARS Coronavirus 2 NEGATIVE NEGATIVE Final    Comment: (NOTE) SARS-CoV-2 target nucleic acids are NOT DETECTED.  The SARS-CoV-2 RNA is generally detectable in upper and lower respiratory specimens during the acute phase of infection. Negative results do not  preclude SARS-CoV-2 infection, do not rule out co-infections with other pathogens, and should not be used as the sole basis for treatment or other patient management decisions. Negative results must be combined with clinical observations, patient history, and epidemiological information. The expected result is Negative.  Fact Sheet for Patients: HairSlick.no  Fact Sheet for Healthcare Providers: quierodirigir.com  This test is not yet approved or cleared by the [place] FDA and  has been authorized for detection and/or diagnosis of SARS-CoV-2 by FDA under an Emergency Use Authorization (EUA). This EUA will remain  in effect (meaning this test can be used) for the duration of the COVID-19 declaration under Se ction 564(b)(1) of the Act, 21 U.S.C. section 360bbb-3(b)(1), unless the authorization is terminated or revoked sooner.  Performed at [facility], [address], [place], [place] [address]   Gastrointestinal Panel by PCR , Stool     Status: Abnormal   Collection Time: 09/18/20  2:50 PM   Specimen: STOOL  Result Value Ref Range Status   Campylobacter species NOT DETECTED NOT DETECTED Final   Plesimonas shigelloides NOT DETECTED NOT DETECTED Final   Salmonella species NOT DETECTED NOT DETECTED Final   Yersinia enterocolitica NOT DETECTED NOT DETECTED Final   Vibrio species NOT DETECTED NOT DETECTED Final   Vibrio cholerae NOT DETECTED NOT DETECTED Final   Enteroaggregative E coli (EAEC) NOT DETECTED NOT DETECTED Final   Enteropathogenic E  coli (EPEC) NOT DETECTED NOT DETECTED Final   Enterotoxigenic E coli (ETEC) NOT DETECTED NOT DETECTED Final   Shiga like toxin producing E coli (STEC) NOT DETECTED NOT DETECTED Final   Shigella/Enteroinvasive E coli (EIEC) NOT DETECTED NOT DETECTED Final   Cryptosporidium NOT DETECTED NOT DETECTED Final   Cyclospora cayetanensis NOT DETECTED NOT DETECTED Final   Entamoeba histolytica NOT DETECTED NOT DETECTED Final   Giardia lamblia NOT DETECTED NOT DETECTED Final   Adenovirus F40/41 NOT DETECTED NOT DETECTED Final   Astrovirus NOT DETECTED NOT DETECTED Final   Norovirus GI/GII DETECTED (A) NOT DETECTED Final    Comment: RESULT CALLED TO, READ BACK BY AND VERIFIED WITH: DESIRAY GRIFFITH AT 1448 09/20/20.PMF    Rotavirus A NOT DETECTED NOT DETECTED Final   Sapovirus (I, II, IV, and V) NOT DETECTED NOT DETECTED Final    Comment: Performed at [facility], [address] [place], [place] [address]  C Difficile Quick Screen w PCR reflex     Status: None   Collection Time: 09/18/20  2:50 PM   Specimen: STOOL  Result Value Ref Range Status   C Diff antigen NEGATIVE NEGATIVE Final   C Diff toxin NEGATIVE NEGATIVE Final   C Diff interpretation No C. difficile detected.  Final    Comment: Performed at [facility], [address] [place], [place] [address]     Medications:    famotidine  20 mg Oral Daily   ferrous sulfate  325 mg Oral Q breakfast   heparin  5,000 Units Subcutaneous Q8H   metoprolol tartrate  50 mg Oral BID   multivitamin with minerals  1 tablet Oral Daily   NIFEdipine  30 mg Oral BID   predniSONE  5 mg Oral Q breakfast   psyllium  1 packet Oral Daily   saccharomyces boulardii  250 mg Oral BID   simvastatin  20 mg Oral QHS   sodium chloride flush  3 mL Intravenous Q12H   tacrolimus  3 mg Oral q AM   tacrolimus  4  mg Oral QHS   Continuous Infusions:  sodium chloride Stopped (09/22/20 0437)   metronidazole 500 mg (09/23/20 0836)      LOS: 5 days    Marinda Elk  [facility]  09/23/2020, 10:13 AM

## 2020-09-23 NOTE — Progress Notes (Signed)
[facility] NEPHROLOGY PROGRESS NOTE  Assessment/ Plan: Pt is a 55 y.o. yo female  with history of hypertension, ESRD status post DDRT in 2013 at [place] follows with Dr. Elvis Coil at [facility], admitted with acute diverticulitis seen as a consultation for the management of acute kidney injury.  #Acute diverticulitis of ascending and lesser extent to descending colon causing nausea vomiting and diarrhea: Cdiff negative.  The stool test is positive for norovirus.  CMV IgM antibody negative.  Currently on Flagyl and ceftriaxone and supportive care per primary team.  Since the diarrhea has been going on for last few months and the patient is not clinically better, I recommend GI consult for their expertise. Immunosuppression can likely be delaying her recovery?   #Acute kidney injury likely hemodynamically mediated in the setting of dehydration/diarrhea concomitant with urinary tract infection.  CT scan ruled out hydronephrosis of transplanted kidney.  UA with urinary tract -Cr increased to 1.8 today, given that she still has some diarrhea along with poor PO intake, will re-initiate IVF: NS @100cc  for 1 day Strict ins and outs, check daily lab and monitor renal recovery.     #Kidney transplant on chronic immunosuppression: DDKT in 2013 at [facility].  Continue Prograf and prednisone.  Continue to hold Myfortic because of GI issues and has leukopenia (which improved to 3.2).  Prograf level sent on admission twice but unknown if it was true 12 hours trough, pending the lab result. Repeat trough drawn 7/5  #Leukopenia -Myfortic on hold, monitor daily CBC  #Hypokalemia -will replete today, k 2.8  #E. coli urinary tract infection: s/p ceftriaxone.   #Non-anion gap metabolic acidosis due to GI loss: stopped iv bicarb, she is now alkalotic which is better today  #Hypertension: Blood pressure acceptable.  Currently on nifedipine and metoprolol.  Continue to  monitor.     #Hypokalemia: Due to GI loss and increased urine loss with IV fluid/cellular shift.  Continue oral potassium chloride.  Monitor lab.  Subjective: Seen and examined bedside. Still having diarrhea but better as compared to yesterday, no episodes overnight. Cr up to 1.8. She reports that she is trying to drink liquids but isn't eating as much. Otherwise no complaints as per patient. Denies abd pain, pain over transplant site, hematuria, dizziness, tremors, worsening headaches, changes in vision. Objective Vital signs in last 24 hours: Vitals:   09/22/20 2152 09/23/20 0124 09/23/20 0530 09/23/20 0732  BP: 129/84 131/84 116/76 122/76  Pulse: 71 70 73 71  Resp: 16 18 20 18   Temp: 99.1 F (37.3 C) 98.9 F (37.2 C) 99 F (37.2 C) 98.7 F (37.1 C)  TempSrc: Oral Oral Oral Oral  SpO2: 97% 98% 93% 93%  Weight:      Height:       Weight change:  No intake or output data in the 24 hours ending 09/23/20 1100      Labs: Basic Metabolic Panel: Recent Labs  Lab 09/19/20 0823 09/20/20 0054 09/21/20 0144 09/22/20 0115 09/23/20 0933  NA 137   < > 139 139 140  K 3.6   < > 3.7 3.5 2.8*  CL 119*   < > 111 101 103  CO2 12*   < > 20* 33* 30  GLUCOSE 97   < > 84 88 113*  BUN 18   < > 11 6 6   CREATININE 1.63*   < > 1.87* 1.38* 1.84*  CALCIUM 8.5*   < > 7.5* 7.0* 7.4*  PHOS 2.1*  --   --   --  1.6*   < > = values in this interval not displayed.   Liver Function Tests: Recent Labs  Lab 09/18/20 1014 09/19/20 0823 09/23/20 0933  AST 17  --   --   ALT 44  --   --   ALKPHOS 81  --   --   BILITOT 0.5  --   --   PROT 5.9*  --   --   ALBUMIN 3.1* 2.5* 2.3*   Recent Labs  Lab 09/18/20 1014  LIPASE 31   No results for input(s): AMMONIA in the last 168 hours. CBC: Recent Labs  Lab 09/18/20 1014 09/19/20 0037 09/21/20 0144 09/22/20 0115 09/23/20 0933  WBC 4.1 4.1 3.0* 3.1* 3.2*  NEUTROABS  --   --  1.4* 1.4* 1.8  HGB 12.1 10.4* 9.8* 9.1* 9.6*  HCT 36.2 31.2* 29.2*  26.8* 29.6*  MCV 93.3 93.4 91.0 92.4 95.2  PLT 289 223 213 194 193   Cardiac Enzymes: No results for input(s): CKTOTAL, CKMB, CKMBINDEX, TROPONINI in the last 168 hours. CBG: Recent Labs  Lab 09/19/20 0838 09/19/20 1246  GLUCAP 100* 126*    Iron Studies: No results for input(s): IRON, TIBC, TRANSFERRIN, FERRITIN in the last 72 hours. Studies/Results: No results found.  Medications: Infusions:  sodium chloride Stopped (09/22/20 0437)   lactated ringers     metronidazole      Scheduled Medications:  famotidine  20 mg Oral Daily   ferrous sulfate  325 mg Oral Q breakfast   heparin  5,000 Units Subcutaneous Q8H   metoprolol tartrate  50 mg Oral BID   multivitamin with minerals  1 tablet Oral Daily   NIFEdipine  30 mg Oral BID   predniSONE  5 mg Oral Q breakfast   psyllium  1 packet Oral Daily   saccharomyces boulardii  250 mg Oral BID   simvastatin  20 mg Oral QHS   sodium chloride flush  3 mL Intravenous Q12H   tacrolimus  3 mg Oral q AM   tacrolimus  4 mg Oral QHS    have reviewed scheduled and prn medications.  Physical Exam: General:NAD Heart:RRR, s1s2 Lungs: cta bl Abdomen:soft, Non-tender, non-distended, no allograft tenderness Extremities:No LE edema Neurology: Alert awake, speech clear and coherent, moves all ext spontaneously, no tremors  Genell Thede 09/23/2020,11:00 AM  LOS: 5 days

## 2020-09-24 ENCOUNTER — Inpatient Hospital Stay (HOSPITAL_COMMUNITY): Payer: Self-pay

## 2020-09-24 LAB — CBC WITH DIFFERENTIAL/PLATELET
Abs Immature Granulocytes: 0.02 10*3/uL (ref 0.00–0.07)
Basophils Absolute: 0 10*3/uL (ref 0.0–0.1)
Basophils Relative: 0 %
Eosinophils Absolute: 0 10*3/uL (ref 0.0–0.5)
Eosinophils Relative: 1 %
HCT: 29 % — ABNORMAL LOW (ref 36.0–46.0)
Hemoglobin: 9.3 g/dL — ABNORMAL LOW (ref 12.0–15.0)
Immature Granulocytes: 1 %
Lymphocytes Relative: 26 %
Lymphs Abs: 0.8 10*3/uL (ref 0.7–4.0)
MCH: 30.9 pg (ref 26.0–34.0)
MCHC: 32.1 g/dL (ref 30.0–36.0)
MCV: 96.3 fL (ref 80.0–100.0)
Monocytes Absolute: 0.5 10*3/uL (ref 0.1–1.0)
Monocytes Relative: 17 %
Neutro Abs: 1.7 10*3/uL (ref 1.7–7.7)
Neutrophils Relative %: 55 %
Platelets: 195 10*3/uL (ref 150–400)
RBC: 3.01 MIL/uL — ABNORMAL LOW (ref 3.87–5.11)
RDW: 12.5 % (ref 11.5–15.5)
WBC: 3.1 10*3/uL — ABNORMAL LOW (ref 4.0–10.5)
nRBC: 0 % (ref 0.0–0.2)

## 2020-09-24 LAB — RENAL FUNCTION PANEL
Albumin: 2.4 g/dL — ABNORMAL LOW (ref 3.5–5.0)
Anion gap: 5 (ref 5–15)
BUN: <5 mg/dL — ABNORMAL LOW (ref 6–20)
CO2: 30 mmol/L (ref 22–32)
Calcium: 7.7 mg/dL — ABNORMAL LOW (ref 8.9–10.3)
Chloride: 103 mmol/L (ref 98–111)
Creatinine, Ser: 1.92 mg/dL — ABNORMAL HIGH (ref 0.44–1.00)
GFR, Estimated: 31 mL/min — ABNORMAL LOW (ref >60)
Glucose, Bld: 91 mg/dL (ref 70–99)
Phosphorus: 1.5 mg/dL — ABNORMAL LOW (ref 2.5–4.6)
Potassium: 3.1 mmol/L — ABNORMAL LOW (ref 3.5–5.1)
Sodium: 138 mmol/L (ref 135–145)

## 2020-09-24 MED ORDER — POTASSIUM CHLORIDE CRYS ER 20 MEQ PO TBCR
40.0000 meq | EXTENDED_RELEASE_TABLET | Freq: Once | ORAL | Status: AC
  Administered 2020-09-24: 40 meq via ORAL
  Filled 2020-09-24: qty 2

## 2020-09-24 MED ORDER — POTASSIUM PHOSPHATES 15 MMOLE/5ML IV SOLN
30.0000 mmol | Freq: Once | INTRAVENOUS | Status: AC
  Administered 2020-09-24: 30 mmol via INTRAVENOUS
  Filled 2020-09-24: qty 10

## 2020-09-24 MED ORDER — MYCOPHENOLATE SODIUM 180 MG PO TBEC
360.0000 mg | DELAYED_RELEASE_TABLET | Freq: Two times a day (BID) | ORAL | Status: DC
  Administered 2020-09-24 – 2020-09-25 (×3): 360 mg via ORAL
  Filled 2020-09-24 (×4): qty 2

## 2020-09-24 MED ORDER — BOOST / RESOURCE BREEZE PO LIQD CUSTOM
1.0000 | Freq: Three times a day (TID) | ORAL | Status: DC
  Administered 2020-09-24: 1 via ORAL

## 2020-09-24 MED ORDER — PROSOURCE PLUS PO LIQD: 30.0000 mL | Freq: Two times a day (BID) | ORAL | Status: DC

## 2020-09-24 MED ORDER — POTASSIUM CHLORIDE CRYS ER 20 MEQ PO TBCR
20.0000 meq | EXTENDED_RELEASE_TABLET | Freq: Once | ORAL | Status: AC
  Administered 2020-09-24: 20 meq via ORAL
  Filled 2020-09-24: qty 1

## 2020-09-24 NOTE — Progress Notes (Addendum)
[facility] NEPHROLOGY PROGRESS NOTE  Assessment/ Plan: Pt is a 55 y.o. yo female  with history of hypertension, ESRD status post DDRT in 2013 at [place] follows with Dr. Elvis Coil at [facility], admitted with acute diverticulitis seen as a consultation for the management of acute kidney injury.  #Acute diverticulitis of ascending and lesser extent to descending colon causing nausea vomiting and diarrhea: Cdiff negative.  The stool test is positive for norovirus.  CMV IgM antibody negative.  Currently on Flagyl and ceftriaxone and supportive care per primary team.  Since the diarrhea has been going on for last few months and the patient is not clinically better, I recommend GI consult for their expertise. Immunosuppression can likely be delaying her recovery?   #Acute kidney injury likely hemodynamically mediated in the setting of dehydration/diarrhea concomitant with urinary tract infection.  CT scan ruled out hydronephrosis of transplanted kidney. -Cr worsened today, up to 1.9. Will resume myfortic (monitoring wbc closely), recheck UA w/ sediment exam, renal transplant u/s ordered. Will continue IVF given poor PO intake and ongoing diarrhea. Strict ins and outs, check daily lab and monitor renal recovery.     #Kidney transplant on chronic immunosuppression: DDKT in 2013 at [facility].  Continue Prograf and prednisone. Prograf level sent on admission twice but unknown if it was true 12 hours trough, 7/1 levels very high but not appropriately drawn. Repeat trough drawn 7/5. Resuming myfortic as above  #Leukopenia -stable, will resume myfortic. If wbc is lower then will need to hold again  #Hypokalemia -replete prn  #E. coli urinary tract infection: s/p ceftriaxone.   #Non-anion gap metabolic acidosis due to GI loss, resolved: stopped iv bicarb, she is now alkalotic which is stable today  #Hypertension: Blood pressure acceptable.  Currently on  nifedipine and metoprolol.  Continue to monitor.     Subjective: Seen and examined bedside. Patient reports that her diarrhea returned, had episodes overnight again. Cr up to 1.9. She reports that she is otherwise has no new complaints, denies pain over transplant site, hematuria, decrease in urinary frequency. Appetite is still poor. Vital signs in last 24 hours: Vitals:   09/23/20 2045 09/23/20 2359 09/24/20 0331 09/24/20 0728  BP: 129/87 132/84 133/83 139/85  Pulse: 67 76 76 80  Resp: 20 16 18 18   Temp: 98.6 F (37 C) 98.6 F (37 C) 99 F (37.2 C) 99.2 F (37.3 C)  TempSrc: Oral Oral Oral Oral  SpO2: 99% 97% 94% 100%  Weight:   76.2 kg   Height:       Weight change:  No intake or output data in the 24 hours ending 09/24/20 1027      Labs: Basic Metabolic Panel: Recent Labs  Lab 09/19/20 0823 09/20/20 0054 09/22/20 0115 09/23/20 0933 09/24/20 0037  NA 137   < > 139 140 138  K 3.6   < > 3.5 2.8* 3.1*  CL 119*   < > 101 103 103  CO2 12*   < > 33* 30 30  GLUCOSE 97   < > 88 113* 91  BUN 18   < > 6 6 <5*  CREATININE 1.63*   < > 1.38* 1.84* 1.92*  CALCIUM 8.5*   < > 7.0* 7.4* 7.7*  PHOS 2.1*  --   --  1.6* 1.5*   < > = values in this interval not displayed.   Liver Function Tests: Recent Labs  Lab 09/18/20 1014 09/19/20 11/20/20 09/23/20 0933 09/24/20 0037  AST 17  --   --   --   ALT 44  --   --   --   ALKPHOS 81  --   --   --   BILITOT 0.5  --   --   --   PROT 5.9*  --   --   --   ALBUMIN 3.1* 2.5* 2.3* 2.4*   Recent Labs  Lab 09/18/20 1014  LIPASE 31   No results for input(s): AMMONIA in the last 168 hours. CBC: Recent Labs  Lab 09/19/20 0037 09/19/20 0037 09/21/20 0144 09/22/20 0115 09/23/20 0933 09/24/20 0037  WBC 4.1  --  3.0* 3.1* 3.2* 3.1*  NEUTROABS  --    < > 1.4* 1.4* 1.8 1.7  HGB 10.4*  --  9.8* 9.1* 9.6* 9.3*  HCT 31.2*  --  29.2* 26.8* 29.6* 29.0*  MCV 93.4  --  91.0 92.4 95.2 96.3  PLT 223  --  213 194 193 195   < > = values in  this interval not displayed.   Cardiac Enzymes: No results for input(s): CKTOTAL, CKMB, CKMBINDEX, TROPONINI in the last 168 hours. CBG: Recent Labs  Lab 09/19/20 0838 09/19/20 1246  GLUCAP 100* 126*    Iron Studies: No results for input(s): IRON, TIBC, TRANSFERRIN, FERRITIN in the last 72 hours. Studies/Results: No results found.  Medications: Infusions:  sodium chloride Stopped (09/22/20 0437)   sodium chloride 100 mL/hr at 09/23/20 2357   metronidazole 500 mg (09/24/20 0948)   potassium PHOSPHATE IVPB (in mmol)      Scheduled Medications:  famotidine  20 mg Oral Daily   ferrous sulfate  325 mg Oral Q breakfast   heparin  5,000 Units Subcutaneous Q8H   metoprolol tartrate  50 mg Oral BID   multivitamin with minerals  1 tablet Oral Daily   NIFEdipine  30 mg Oral BID   predniSONE  5 mg Oral Q breakfast   psyllium  1 packet Oral Daily   saccharomyces boulardii  250 mg Oral BID   simvastatin  20 mg Oral QHS   sodium chloride flush  3 mL Intravenous Q12H   tacrolimus  3 mg Oral q AM   tacrolimus  4 mg Oral QHS    have reviewed scheduled and prn medications.  Physical Exam: General:NAD Heart:RRR, s1s2 Lungs: cta bl Abdomen:soft, Non-tender, non-distended, no allograft tenderness Extremities:No LE edema Neurology: Alert awake, speech clear and coherent, moves all ext spontaneously, no tremors  Glendola Friedhoff 09/24/2020,10:27 AM  LOS: 6 days

## 2020-09-24 NOTE — Progress Notes (Signed)
[facility] PROGRESS NOTE    Progress Note  Melanie [name]  NOB:[id number] DOB: 1965/06/08 DOA: 09/18/2020 PCP: Patient, No Pcp Per (Inactive)     Brief Narrative:   Melanie [name] is an 55 y.o. female past medical history kidney transplant on the right on chronic immunosuppressive therapy since 2013, who comes in complaining of diarrhea and abdominal pain.  She relates she has been follow-up by Dr. Janee Morn as an outpatient and she was having 3 bowel movements a day after her episode of pancreatitis on March 2022 but her bowel movements have increased to 6-7 nonbloody.  He was placed on Imodium which helped.  Recently she started having nausea with poor appetite and about a 16 pound weight loss.  CT scan of the abdomen and pelvis was done in the ED that showed diverticulitis involving the ascending colon.  Assessment/Plan:   Acute  watery diarrhea: Likely due to Norovirus.  Antibiotics were discontinued.  Diarrhea has improved with supportive care, on as needed Imodium.  She is tolerating her diet better.  Renal function gradually improving.    Possible acute urinary tract infection: Treated with Rocephin.  AKI (acute kidney injury) (HCC) - Back in March 2022 her creatinine was around 1, in the setting of nausea vomiting and GI losses. Appreciate renal's assistance, she has history of renal transplant and is on transplant medications per nephrology.  Non-anion gap metabolic acidosis: Per renal IV fluids.  Essential hypertension: Continue current regimen blood pressure seems to be at goal.    Unintentional weight loss: About 16 pounds since March will need to follow-up with GI as an outpatient. She will need a colonoscopy. TSH 1.1.  History of hypokalemia: Continue current home regimen potassium seems to be stable.  Normocytic anemia: She relates no melanotic stools or bright red blood per rectum, she will need a colonoscopy as an outpatient. Continue to monitor  hemoglobin.    DVT prophylaxis: lovenox Family Communication:none Status is: Inpatient  Remains inpatient appropriate because:Hemodynamically unstable  Dispo: The patient is from: Home              Anticipated d/c is to: Home              Patient currently is not medically stable to d/c.   Difficult to place patient No    Code Status:     Code Status Orders  (From admission, onward)           Start     Ordered   09/18/20 1507  Full code  Continuous        09/18/20 1509           Code Status History     This patient has a current code status but no historical code status.         IV Access:   Peripheral IV   Procedures and diagnostic studies:   No results found.   Medical Consultants:   Renal   Subjective:    Patient in bed, appears comfortable, denies any headache, no fever, no chest pain or pressure, no shortness of breath , no abdominal pain. No new focal weakness.   Objective:    Vitals:   09/23/20 2045 09/23/20 2359 09/24/20 0331 09/24/20 0728  BP: 129/87 132/84 133/83 139/85  Pulse: 67 76 76 80  Resp: 20 16 18 18   Temp: 98.6 F (37 C) 98.6 F (37 C) 99 F (37.2 C) 99.2 F (37.3 C)  TempSrc: Oral Oral Oral Oral  SpO2:  99% 97% 94% 100%  Weight:   76.2 kg   Height:       SpO2: 100 %  No intake or output data in the 24 hours ending 09/24/20 1452 Filed Weights   09/19/20 0509 09/24/20 0331  Weight: 77 kg 76.2 kg    Exam:  Awake Alert, No new F.N deficits, Normal affect [place].AT,PERRAL Supple Neck,No JVD, No cervical lymphadenopathy appriciated.  Symmetrical Chest wall movement, Good air movement bilaterally, CTAB RRR,No Gallops, Rubs or new Murmurs, No Parasternal Heave +ve B.Sounds, Abd Soft, No tenderness, No organomegaly appriciated, No rebound - guarding or rigidity. No Cyanosis, Clubbing or edema, No new Rash or bruise    Data Reviewed:    Recent Labs  Lab 09/19/20 0037 09/21/20 0144 09/22/20 0115  09/23/20 0933 09/24/20 0037  WBC 4.1 3.0* 3.1* 3.2* 3.1*  HGB 10.4* 9.8* 9.1* 9.6* 9.3*  HCT 31.2* 29.2* 26.8* 29.6* 29.0*  PLT 223 213 194 193 195  MCV 93.4 91.0 92.4 95.2 96.3  MCH 31.1 30.5 31.4 30.9 30.9  MCHC 33.3 33.6 34.0 32.4 32.1  RDW 12.2 12.1 12.1 12.4 12.5  LYMPHSABS  --  1.0 1.1 0.8 0.8  MONOABS  --  0.6 0.5 0.5 0.5  EOSABS  --  0.0 0.1 0.1 0.0  BASOSABS  --  0.0 0.0 0.0 0.0    Recent Labs  Lab 09/18/20 1014 09/18/20 2252 09/19/20 0037 09/19/20 0823 09/20/20 0054 09/20/20 1802 09/21/20 0144 09/22/20 0115 09/23/20 0933 09/24/20 0037  NA 135  --    < > 137   < > 138 139 139 140 138  K 4.0  --    < > 3.6   < > 3.2* 3.7 3.5 2.8* 3.1*  CL 113*  --    < > 119*   < > 109 111 101 103 103  CO2 13*  --    < > 12*   < > 20* 20* 33* 30 30  GLUCOSE 146*  --    < > 97   < > 96 84 88 113* 91  BUN 24*  --    < > 18   < > 12 11 6 6  <5*  CREATININE 2.43*  --    < > 1.63*   < > 1.96* 1.87* 1.38* 1.84* 1.92*  CALCIUM 9.2  --    < > 8.5*   < > 7.6* 7.5* 7.0* 7.4* 7.7*  AST 17  --   --   --   --   --   --   --   --   --   ALT 44  --   --   --   --   --   --   --   --   --   ALKPHOS 81  --   --   --   --   --   --   --   --   --   BILITOT 0.5  --   --   --   --   --   --   --   --   --   ALBUMIN 3.1*  --   --  2.5*  --   --   --   --  2.3* 2.4*  TSH  --  1.164  --   --   --   --   --   --   --   --    < > = values in this interval not  displayed.    Recent Labs  Lab 09/18/20 1014 09/18/20 1310 09/19/20 0037 09/19/20 0823 09/21/20 0144 09/22/20 0115 09/23/20 0933 09/24/20 0037  WBC 4.1  --  4.1  --  3.0* 3.1* 3.2* 3.1*  HGB 12.1  --  10.4*  --  9.8* 9.1* 9.6* 9.3*  HCT 36.2  --  31.2*  --  29.2* 26.8* 29.6* 29.0*  PLT 289  --  223  --  213 194 193 195  AST 17  --   --   --   --   --   --   --   ALT 44  --   --   --   --   --   --   --   ALKPHOS 81  --   --   --   --   --   --   --   BILITOT 0.5  --   --   --   --   --   --   --   ALBUMIN 3.1*  --   --  2.5*  --    --  2.3* 2.4*  SARSCOV2NAA  --  NEGATIVE  --   --   --   --   --   --        Microbiology Recent Results (from the past 240 hour(s))  SARS CORONAVIRUS 2 (TAT 6-24 HRS)     Status: None   Collection Time: 09/18/20  1:10 PM  Result Value Ref Range Status   SARS Coronavirus 2 NEGATIVE NEGATIVE Final    Comment: (NOTE) SARS-CoV-2 target nucleic acids are NOT DETECTED.  The SARS-CoV-2 RNA is generally detectable in upper and lower respiratory specimens during the acute phase of infection. Negative results do not preclude SARS-CoV-2 infection, do not rule out co-infections with other pathogens, and should not be used as the sole basis for treatment or other patient management decisions. Negative results must be combined with clinical observations, patient history, and epidemiological information. The expected result is Negative.  Fact Sheet for Patients: HairSlick.no  Fact Sheet for Healthcare Providers: quierodirigir.com  This test is not yet approved or cleared by the [place] FDA and  has been authorized for detection and/or diagnosis of SARS-CoV-2 by FDA under an Emergency Use Authorization (EUA). This EUA will remain  in effect (meaning this test can be used) for the duration of the COVID-19 declaration under Se ction 564(b)(1) of the Act, 21 U.S.C. section 360bbb-3(b)(1), unless the authorization is terminated or revoked sooner.  Performed at [facility], [address], [place], [place] [address]   Gastrointestinal Panel by PCR , Stool     Status: Abnormal   Collection Time: 09/18/20  2:50 PM   Specimen: STOOL  Result Value Ref Range Status   Campylobacter species NOT DETECTED NOT DETECTED Final   Plesimonas shigelloides NOT DETECTED NOT DETECTED Final   Salmonella species NOT DETECTED NOT DETECTED Final   Yersinia enterocolitica NOT DETECTED NOT DETECTED Final   Vibrio species NOT DETECTED NOT DETECTED  Final   Vibrio cholerae NOT DETECTED NOT DETECTED Final   Enteroaggregative E coli (EAEC) NOT DETECTED NOT DETECTED Final   Enteropathogenic E coli (EPEC) NOT DETECTED NOT DETECTED Final   Enterotoxigenic E coli (ETEC) NOT DETECTED NOT DETECTED Final   Shiga like toxin producing E coli (STEC) NOT DETECTED NOT DETECTED Final   Shigella/Enteroinvasive E coli (EIEC) NOT DETECTED NOT DETECTED Final   Cryptosporidium NOT DETECTED NOT DETECTED Final  Cyclospora cayetanensis NOT DETECTED NOT DETECTED Final   Entamoeba histolytica NOT DETECTED NOT DETECTED Final   Giardia lamblia NOT DETECTED NOT DETECTED Final   Adenovirus F40/41 NOT DETECTED NOT DETECTED Final   Astrovirus NOT DETECTED NOT DETECTED Final   Norovirus GI/GII DETECTED (A) NOT DETECTED Final    Comment: RESULT CALLED TO, READ BACK BY AND VERIFIED WITH: DESIRAY GRIFFITH AT 1448 09/20/20.PMF    Rotavirus A NOT DETECTED NOT DETECTED Final   Sapovirus (I, II, IV, and V) NOT DETECTED NOT DETECTED Final    Comment: Performed at [facility], [address] [place], [place] [address]  C Difficile Quick Screen w PCR reflex     Status: None   Collection Time: 09/18/20  2:50 PM   Specimen: STOOL  Result Value Ref Range Status   C Diff antigen NEGATIVE NEGATIVE Final   C Diff toxin NEGATIVE NEGATIVE Final   C Diff interpretation No C. difficile detected.  Final    Comment: Performed at [facility], [address] [place], [place] [address]     Medications:    famotidine  20 mg Oral Daily   ferrous sulfate  325 mg Oral Q breakfast   heparin  5,000 Units Subcutaneous Q8H   metoprolol tartrate  50 mg Oral BID   multivitamin with minerals  1 tablet Oral Daily   mycophenolate  360 mg Oral BID   NIFEdipine  30 mg Oral BID   predniSONE  5 mg Oral Q breakfast   psyllium  1 packet Oral Daily   saccharomyces boulardii  250 mg Oral BID   simvastatin  20 mg Oral QHS   sodium chloride flush  3 mL Intravenous Q12H    tacrolimus  3 mg Oral q AM   tacrolimus  4 mg Oral QHS   Continuous Infusions:  sodium chloride Stopped (09/22/20 0437)   sodium chloride 100 mL/hr at 09/23/20 2357   metronidazole 500 mg (09/24/20 0948)   potassium PHOSPHATE IVPB (in mmol) 30 mmol (09/24/20 1323)      LOS: 6 days   Signature  Susa Raring M.D on 09/24/2020 at 2:53 PM   -  To page go to [email or web address]

## 2020-09-24 NOTE — Progress Notes (Signed)
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  Ongoing  GOAL:   Patient will meet greater than or equal to 90% of their needs  Progressing   MONITOR:   PO intake, Supplement acceptance, Labs, Weight trends, Skin, I & O's  REASON FOR ASSESSMENT:   Malnutrition Screening Tool    ASSESSMENT:   Melanie [name] is a 55 y.o. female with medical history significant of s/p right kidney transplant on chronic immunosuppressive therapy since 2013 who presents with complaints of diarrhea and abdominal pain.  Reviewed I/O's: +11 since admission  Pt unavailable at time of visit.   Per MD notes, diarrhea improving. Plan for GI consult.   Per chart review, pt is tolerating diet, but remains with minimal intake. Noted Ensure supplements were discontinued.   Medications reviewed and include ferrous sulfate, prednisone, metamucil, and florastor.   Labs reviewed: K: 3.1. Phos: 1.5.    Diet Order:   Diet Order             Diet Heart Room service appropriate? Yes; Fluid consistency: Thin  Diet effective now                   EDUCATION NEEDS:   Education needs have been addressed  Skin:  Skin Assessment: Reviewed RN Assessment  Last BM:  09/23/20  Height:   Ht Readings from Last 1 Encounters:  09/21/20 5\' 4"  (1.626 m)    Weight:   Wt Readings from Last 1 Encounters:  09/24/20 76.2 kg    Ideal Body Weight:  54.5  kg  BMI:  Body mass index is 28.84 kg/m.  Estimated Nutritional Needs:   Kcal:  1900-2100  Protein:  95-110 grams  Fluid:  > 1.9 L    11/25/20, RD, LDN, CDCES Registered Dietitian II Certified Diabetes Care and Education Specialist Please refer to [facility] for RD and/or RD on-call/weekend/after hours pager

## 2020-09-25 ENCOUNTER — Encounter (HOSPITAL_COMMUNITY): Payer: Self-pay | Admitting: Internal Medicine

## 2020-09-25 LAB — CBC WITH DIFFERENTIAL/PLATELET
Abs Immature Granulocytes: 0.02 10*3/uL (ref 0.00–0.07)
Basophils Absolute: 0 10*3/uL (ref 0.0–0.1)
Basophils Relative: 0 %
Eosinophils Absolute: 0 10*3/uL (ref 0.0–0.5)
Eosinophils Relative: 1 %
HCT: 27.5 % — ABNORMAL LOW (ref 36.0–46.0)
Hemoglobin: 9 g/dL — ABNORMAL LOW (ref 12.0–15.0)
Immature Granulocytes: 1 %
Lymphocytes Relative: 29 %
Lymphs Abs: 0.9 10*3/uL (ref 0.7–4.0)
MCH: 31.4 pg (ref 26.0–34.0)
MCHC: 32.7 g/dL (ref 30.0–36.0)
MCV: 95.8 fL (ref 80.0–100.0)
Monocytes Absolute: 0.5 10*3/uL (ref 0.1–1.0)
Monocytes Relative: 16 %
Neutro Abs: 1.7 10*3/uL (ref 1.7–7.7)
Neutrophils Relative %: 53 %
Platelets: 175 10*3/uL (ref 150–400)
RBC: 2.87 MIL/uL — ABNORMAL LOW (ref 3.87–5.11)
RDW: 12.6 % (ref 11.5–15.5)
WBC: 3.2 10*3/uL — ABNORMAL LOW (ref 4.0–10.5)
nRBC: 0 % (ref 0.0–0.2)

## 2020-09-25 LAB — RENAL FUNCTION PANEL
Albumin: 2.2 g/dL — ABNORMAL LOW (ref 3.5–5.0)
Anion gap: 8 (ref 5–15)
BUN: <5 mg/dL — ABNORMAL LOW (ref 6–20)
CO2: 23 mmol/L (ref 22–32)
Calcium: 7.8 mg/dL — ABNORMAL LOW (ref 8.9–10.3)
Chloride: 108 mmol/L (ref 98–111)
Creatinine, Ser: 1.63 mg/dL — ABNORMAL HIGH (ref 0.44–1.00)
GFR, Estimated: 37 mL/min — ABNORMAL LOW (ref >60)
Glucose, Bld: 75 mg/dL (ref 70–99)
Phosphorus: 2.4 mg/dL — ABNORMAL LOW (ref 2.5–4.6)
Potassium: 3.7 mmol/L (ref 3.5–5.1)
Sodium: 139 mmol/L (ref 135–145)

## 2020-09-25 LAB — TACROLIMUS LEVEL: Tacrolimus (FK506) - LabCorp: 8.5 ng/mL (ref 2.0–20.0)

## 2020-09-25 MED ORDER — LOPERAMIDE HCL 2 MG PO TABS: 2.0000 mg | ORAL_TABLET | Freq: Four times a day (QID) | ORAL | 0 refills | Status: AC | PRN

## 2020-09-25 NOTE — Progress Notes (Signed)
Discharge instructions (including medications) discussed with and copy provided to patient/caregiver 

## 2020-09-25 NOTE — Plan of Care (Signed)
  Problem: Education: Goal: Knowledge of General Education information will improve Description: Including pain rating scale, medication(s)/side effects and non-pharmacologic comfort measures Outcome: Adequate for Discharge   

## 2020-09-25 NOTE — Discharge Summary (Signed)
Veatrice Eckstein VOP:[id number] DOB: [birth year] DOA: 09/18/2020  PCP: Patient, No Pcp Per (Inactive)  Admit date: 09/18/2020  Discharge date: 09/25/2020  Admitted From: Home Disposition:  Home   Recommendations for Outpatient Follow-up:   Follow up with PCP in 1-2 weeks  PCP Please obtain BMP/CBC, 2 view CXR in 1week,  (see Discharge instructions)   PCP Please follow up on the following pending results: needs close outpt GI and Renal follow up   Home Health: None   Equipment/Devices: None  Consultations: Renal Discharge Condition: Stable    CODE STATUS: Full    Diet Recommendation: Heart Healthy   Diet Order             Diet - low sodium heart healthy           Diet Heart Room service appropriate? Yes; Fluid consistency: Thin  Diet effective now                    Chief Complaint  Patient presents with   Abdominal Pain     Brief history of present illness from the day of admission and additional interim summary    Keyunna Coco is an 55 y.o. female past medical history kidney transplant on the right on chronic immunosuppressive therapy since 2013, who comes in complaining of diarrhea and abdominal pain.  She relates she has been follow-up by Dr. Janee Morn as an outpatient and she was having 3 bowel movements a day after her episode of pancreatitis on March 2022 but her bowel movements have increased to 6-7 nonbloody.  He was placed on Imodium which helped.  Recently she started having nausea with poor appetite and about a 16 pound weight loss.  CT scan of the abdomen and pelvis was done in the ED that showed diverticulitis involving the ascending colon.                                                                 Hospital Course    Acute  watery diarrhea: Likely due to Norovirus, also underwent short course  of antibiotic treatment due to CT evidence of possible diverticulitis but this could have been viral infection as well, diarrhea has resolved she has no abdominal pain tolerating oral diet, renal function has improved, she will be discharge today outpatient PCP follow-up, PCP to arrange outpatient GI follow-up for colonoscopy.   Possible acute urinary tract infection: Treated with Rocephin.   AKI (acute kidney injury) (HCC) - Back in March 2022 her creatinine was around 1, in the setting of nausea vomiting and GI losses. Appreciate renal's assistance, she has history of renal transplant and is on transplant medications per nephrology.  After hydration with IV fluid renal function has improved, case discussed with nephrologist Dr. Thedore Mins today, okay for discharge with outpatient  follow-up with her primary nephrologist Dr. Hyman Hopes in 1 to 2 weeks.   Essential hypertension: Continue current regimen blood pressure seems to be at goal.    Unintentional weight loss: About 16 pounds since March will need to follow-up with GI as an outpatient. PCP to monitor. She will need a colonoscopy. TSH 1.1.   History of hypokalemia: Continue current home regimen potassium seems to be stable.   Normocytic anemia: She relates no melanotic stools or bright red blood per rectum, she will need a colonoscopy as an outpatient.     Discharge diagnosis     Principal Problem:   Diverticulitis Active Problems:   AKI (acute kidney injury) (HCC)   Acute lower UTI   S/P kidney transplant   Essential hypertension   Diarrhea   Weight loss   History of immunosuppressive therapy   Malnutrition of moderate degree    Discharge instructions    Discharge Instructions     Diet - low sodium heart healthy   Complete by: As directed    Discharge instructions   Complete by: As directed    Follow with Primary MD in 7 days   Get CBC, CMP, 2 view Chest X ray -  checked next visit within 1 week by Primary MD     Activity: As tolerated with Full fall precautions use walker/cane & assistance as needed  Disposition Home    Diet: Heart Healthy   Special Instructions: If you have smoked or chewed Tobacco  in the last 2 yrs please stop smoking, stop any regular Alcohol  and or any Recreational drug use.  On your next visit with your primary care physician please Get Medicines reviewed and adjusted.  Please request your Prim.MD to go over all Hospital Tests and Procedure/Radiological results at the follow up, please get all Hospital records sent to your Prim MD by signing hospital release before you go home.  If you experience worsening of your admission symptoms, develop shortness of breath, life threatening emergency, suicidal or homicidal thoughts you must seek medical attention immediately by calling 911 or calling your MD immediately  if symptoms less severe.  You Must read complete instructions/literature along with all the possible adverse reactions/side effects for all the Medicines you take and that have been prescribed to you. Take any new Medicines after you have completely understood and accpet all the possible adverse reactions/side effects.   Increase activity slowly   Complete by: As directed        Discharge Medications   Allergies as of 09/25/2020   No Known Allergies      Medication List     TAKE these medications    famotidine 20 MG tablet Commonly known as: PEPCID Take 20 mg by mouth as needed for heartburn or indigestion.   ferrous sulfate 325 (65 FE) MG tablet Take 325 mg by mouth daily with breakfast.   loperamide 2 MG tablet Commonly known as: Imodium A-D Take 1 tablet (2 mg total) by mouth 4 (four) times daily as needed for diarrhea or loose stools.   megestrol 40 MG tablet Commonly known as: MEGACE Take 0.5 tablets (20 mg total) by mouth daily. Can increase to two tablets twice a day in the event of heavy bleeding   metoprolol tartrate 100 MG  tablet Commonly known as: LOPRESSOR Take 100 mg by mouth 2 (two) times daily.   mycophenolate 360 MG Tbec EC tablet Commonly known as: MYFORTIC Take 360 mg by mouth 2 (two) times daily.  NIFEdipine 60 MG 24 hr tablet Commonly known as: ADALAT CC Take 60 mg by mouth 2 (two) times daily.   oxyCODONE-acetaminophen 5-325 MG tablet Commonly known as: PERCOCET/ROXICET Take 1 tablet by mouth every 8 (eight) hours as needed for severe pain.   potassium chloride SA 20 MEQ tablet Commonly known as: KLOR-CON Take 20 mEq by mouth 2 (two) times daily.   predniSONE 5 MG tablet Commonly known as: DELTASONE Take 5 mg by mouth daily with breakfast.   simvastatin 20 MG tablet Commonly known as: ZOCOR Take 20 mg by mouth at bedtime.   tacrolimus 1 MG capsule Commonly known as: PROGRAF Take 3-4 mg by mouth See admin instructions. Take 3 mg tablet by mouth in the morning and 4 mg tablet by mouth at night.         Follow-up Information     [facility]. Go on 10/22/2020.   Why: at 9:10am for hospital follow-up Contact information: [address] [place] [address] [phone]        Elvis Coil, MD. Schedule an appointment as soon as possible for a visit in 1 week(s).   Specialty: Nephrology Contact information: [address] [place] [address] [phone]         Kathi Der, MD. Schedule an appointment as soon as possible for a visit in 1 week(s).   Specialty: Gastroenterology Why: colitis Contact information: [address] [place] [address] [phone]                 Major procedures and Radiology Reports - PLEASE review detailed and final reports thoroughly  -       CT ABDOMEN PELVIS WO CONTRAST  Result Date: 09/18/2020 CLINICAL DATA:  Abdominal pain, concern for diverticulitis. EXAM: CT ABDOMEN AND PELVIS WITHOUT CONTRAST TECHNIQUE: Multidetector CT imaging of the abdomen  and pelvis was performed following the standard protocol without IV contrast. COMPARISON:  MR abdomen dated 07/13/2020 and CT abdomen pelvis dated 06/11/2020. FINDINGS: Lower chest: No acute abnormality. Hepatobiliary: A small cyst is seen in the right hepatic lobe, measuring 4 mm. Status post cholecystectomy. No biliary dilatation. Pancreas: Unremarkable. No pancreatic ductal dilatation or surrounding inflammatory changes. Spleen: Normal in size without focal abnormality. Adrenals/Urinary Tract: Adrenal glands are unremarkable. The native kidneys are atrophic with a few small cysts. No hydronephrosis. A right lower quadrant transplant kidney is noted with a nonobstructive 6 mm calculus in the lower pole. No hydronephrosis or focal lesion. Bladder is unremarkable. Stomach/Bowel: Stomach is within normal limits. Appendix appears normal. Diverticula are seen in the ascending colon with mild associated fat stranding, most consistent with diverticulitis. There are also diverticula and minimal associated fat stranding involving the descending colon (series 3, images 47-52). No evidence of abscess or fistula formation in either location. No evidence of bowel obstruction. Vascular/Lymphatic: Aortic atherosclerosis. No enlarged abdominal or pelvic lymph nodes. Reproductive: Multiple uterine fibroids are noted. Other: Focal diastasis in the anterior abdominal wall is unchanged. No abdominopelvic ascites. Musculoskeletal: No acute or significant osseous findings. IMPRESSION: Findings most consistent with diverticulitis involving the ascending colon and to a lesser extent the descending colon. No evidence of abscess or fistula formation. Electronically Signed   By: Romona Curls M.D.   On: 09/18/2020 16:01   US Renal Transplant w/Doppler  Result Date: 09/24/2020 CLINICAL DATA:  Acute kidney injury EXAM: ULTRASOUND OF RENAL TRANSPLANT WITH RENAL DOPPLER ULTRASOUND TECHNIQUE: Ultrasound examination of the renal transplant was  performed with gray-scale, color and duplex doppler evaluation. COMPARISON:  CT 09/18/2020 FINDINGS: Transplant kidney location: Right pelvis Transplant Kidney: Renal measurements: 12.9 x 7.7 x 7.2 cm = volume: . Prominent fat in the renal sinus with thinning of the parenchyma. No significant hydronephrosis. Mildly increased echotexture throughout. No solid mass lesion. Small cyst measuring 1.9 cm maximal diameter. Echogenic focus in the midpole measuring 1 cm maximal diameter consistent with a stone, as previously seen on CT. Color flow in the main renal artery:  Present Color flow in the main renal vein:  Present Duplex Doppler Evaluation: Main Renal Artery Velocity: 36.2 cm/sec Main Renal Artery Resistive Index: 0.68 Venous waveform in main renal vein:  Present Intrarenal resistive index in upper pole:  0.65 (normal 0.6-0.8; equivocal 0.8-0.9; abnormal >= 0.9) Intrarenal resistive index in lower pole: 0.71 (normal 0.6-0.8; equivocal 0.8-0.9; abnormal >= 0.9) Bladder: Normal for degree of bladder distention. Other findings:  None. IMPRESSION: Right lower quadrant transplant kidney. Nonobstructing stone demonstrated in the midportion of the transplant kidney. Normal Doppler waveforms with normal resistive indices. Electronically Signed   By: Burman Nieves M.D.   On: 09/24/2020 19:54    Micro Results    Recent Results (from the past 240 hour(s))  SARS CORONAVIRUS 2 (TAT 6-24 HRS)     Status: None   Collection Time: 09/18/20  1:10 PM  Result Value Ref Range Status   SARS Coronavirus 2 NEGATIVE NEGATIVE Final    Comment: (NOTE) SARS-CoV-2 target nucleic acids are NOT DETECTED.  The SARS-CoV-2 RNA is generally detectable in upper and lower respiratory specimens during the acute phase of infection. Negative results do not preclude SARS-CoV-2 infection, do not rule out co-infections with other pathogens, and should not be used as the sole basis for treatment or other patient management  decisions. Negative results must be combined with clinical observations, patient history, and epidemiological information. The expected result is Negative.  Fact Sheet for Patients: HairSlick.no  Fact Sheet for Healthcare Providers: quierodirigir.com  This test is not yet approved or cleared by the [place] FDA and  has been authorized for detection and/or diagnosis of SARS-CoV-2 by FDA under an Emergency Use Authorization (EUA). This EUA will remain  in effect (meaning this test can be used) for the duration of the COVID-19 declaration under Se ction 564(b)(1) of the Act, 21 U.S.C. section 360bbb-3(b)(1), unless the authorization is terminated or revoked sooner.  Performed at [facility], [address] [place], [place] [address]   Gastrointestinal Panel by PCR , Stool     Status: Abnormal   Collection Time: 09/18/20  2:50 PM   Specimen: STOOL  Result Value Ref Range Status   Campylobacter species NOT DETECTED NOT DETECTED Final   Plesimonas shigelloides NOT DETECTED NOT DETECTED Final   Salmonella species NOT DETECTED NOT DETECTED Final   Yersinia enterocolitica NOT DETECTED NOT DETECTED Final   Vibrio species NOT DETECTED NOT DETECTED Final   Vibrio cholerae NOT DETECTED NOT DETECTED Final   Enteroaggregative E coli (EAEC) NOT DETECTED NOT DETECTED Final   Enteropathogenic E coli (EPEC) NOT DETECTED NOT DETECTED Final   Enterotoxigenic E coli (ETEC) NOT DETECTED NOT DETECTED Final   Shiga like toxin producing E coli (STEC) NOT DETECTED NOT DETECTED Final   Shigella/Enteroinvasive E coli (EIEC) NOT DETECTED NOT DETECTED Final   Cryptosporidium NOT DETECTED NOT DETECTED Final   Cyclospora cayetanensis NOT DETECTED NOT DETECTED Final   Entamoeba histolytica NOT DETECTED NOT DETECTED Final   Giardia  lamblia NOT DETECTED NOT DETECTED Final   Adenovirus F40/41 NOT DETECTED NOT DETECTED Final   Astrovirus NOT  DETECTED NOT DETECTED Final   Norovirus GI/GII DETECTED (A) NOT DETECTED Final    Comment: RESULT CALLED TO, READ BACK BY AND VERIFIED WITH: DESIRAY GRIFFITH AT 1448 09/20/20.PMF    Rotavirus A NOT DETECTED NOT DETECTED Final   Sapovirus (I, II, IV, and V) NOT DETECTED NOT DETECTED Final    Comment: Performed at [facility], [address] [place], [place] [address]  C Difficile Quick Screen w PCR reflex     Status: None   Collection Time: 09/18/20  2:50 PM   Specimen: STOOL  Result Value Ref Range Status   C Diff antigen NEGATIVE NEGATIVE Final   C Diff toxin NEGATIVE NEGATIVE Final   C Diff interpretation No C. difficile detected.  Final    Comment: Performed at [facility], [address] [place], [place] [address]    Today   Subjective    Berniece PapLora Pippenger today has no headache,no chest abdominal pain,no new weakness tingling or numbness, feels much better wants to go home today.   Objective   Blood pressure 137/90, pulse 76, temperature 99.3 F (37.4 C), temperature source Oral, resp. rate 20, height 5\' 4"  (1.626 m), weight 76.2 kg, last menstrual period 05/26/2020, SpO2 97 %.  No intake or output data in the 24 hours ending 09/25/20 1005  Exam  Awake Alert, No new F.N deficits, Normal affect [place].AT,PERRAL Supple Neck,No JVD, No cervical lymphadenopathy appriciated.  Symmetrical Chest wall movement, Good air movement bilaterally, CTAB RRR,No Gallops,Rubs or new Murmurs, No Parasternal Heave +ve B.Sounds, Abd Soft, Non tender, No organomegaly appriciated, No rebound -guarding or rigidity. No Cyanosis, Clubbing or edema, No new Rash or bruise   Data Review   CBC w Diff:  Lab Results  Component Value Date   WBC 3.2 (L) 09/25/2020   HGB 9.0 (L) 09/25/2020   HCT 27.5 (L) 09/25/2020   PLT 175 09/25/2020   LYMPHOPCT 29 09/25/2020   MONOPCT 16 09/25/2020   EOSPCT 1 09/25/2020   BASOPCT 0 09/25/2020    CMP:  Lab Results  Component Value Date   NA 139  09/25/2020   K 3.7 09/25/2020   CL 108 09/25/2020   CO2 23 09/25/2020   BUN <5 (L) 09/25/2020   CREATININE 1.63 (H) 09/25/2020   PROT 5.9 (L) 09/18/2020   ALBUMIN 2.2 (L) 09/25/2020   BILITOT 0.5 09/18/2020   ALKPHOS 81 09/18/2020   AST 17 09/18/2020   ALT 44 09/18/2020  .   Total Time in preparing paper work, data evaluation and todays exam - 35 minutes  Susa RaringPrashant Nico Rogness M.D on 09/25/2020 at 10:05 AM  [facility]

## 2020-09-25 NOTE — Progress Notes (Signed)
[facility] NEPHROLOGY PROGRESS NOTE  Assessment/ Plan: Pt is a 55 y.o. yo female  with history of hypertension, ESRD status post DDRT in 2013 at [place] follows with Dr. Elvis Coil at [facility], admitted with acute diverticulitis seen as a consultation for the management of acute kidney injury.  #Acute diverticulitis of ascending and lesser extent to descending colon causing nausea vomiting and diarrhea: Cdiff negative.  The stool test is positive for norovirus.  CMV IgM antibody negative.  diarrhea finally improving. mmunosuppression can likely be delaying her recovery?   #Acute kidney injury likely hemodynamically mediated in the setting of dehydration/diarrhea concomitant with urinary tract infection.  CT scan ruled out hydronephrosis of transplanted kidney. -Cr improved today. Renal transplant u/s unremarkable Strict ins and outs, check daily lab and monitor renal recovery.     #Kidney transplant on chronic immunosuppression: DDKT in 2013 at [facility].  Continue Prograf and prednisone. Prograf level sent on admission twice but unknown if it was true 12 hours trough, 7/1 levels very high but not appropriately drawn. Repeat trough drawn 7/5. Resumed myfortic as above  #Leukopenia -stable, will resume myfortic. If wbc is lower then will need to hold again  #Hypokalemia -replete prn  #E. coli urinary tract infection: s/p ceftriaxone.   #Non-anion gap metabolic acidosis due to GI loss, resolved: stopped iv bicarb, she is now alkalotic which is stable today  #Hypertension: Blood pressure acceptable.  Currently on nifedipine and metoprolol.  Continue to monitor.    Okay for d/c from a nephrology perspective. Message sent to our office to get her scheduled for a follow up appointment next week with Dr. Hyman Hopes. Please call with any questions/concerns.   Subjective: Seen and examined bedside. Patient reports that she is no longer having diarrhea and  feels as if her appetite is picking back up. No other complaints, off ivf currently. Eager to go home. Vital signs in last 24 hours: Vitals:   09/24/20 2031 09/24/20 2328 09/25/20 0424 09/25/20 0700  BP: (!) 135/91 (!) 146/90 129/85 137/90  Pulse: 79 71 83 76  Resp: 18 19 19 20   Temp: 98.9 F (37.2 C) 98.9 F (37.2 C) 98.7 F (37.1 C) 99.3 F (37.4 C)  TempSrc: Oral Oral Oral Oral  SpO2: 98% 100% 94% 97%  Weight:      Height:       Weight change:  No intake or output data in the 24 hours ending 09/25/20 1123      Labs: Basic Metabolic Panel: Recent Labs  Lab 09/23/20 0933 09/24/20 0037 09/25/20 0216  NA 140 138 139  K 2.8* 3.1* 3.7  CL 103 103 108  CO2 30 30 23   GLUCOSE 113* 91 75  BUN 6 <5* <5*  CREATININE 1.84* 1.92* 1.63*  CALCIUM 7.4* 7.7* 7.8*  PHOS 1.6* 1.5* 2.4*   Liver Function Tests: Recent Labs  Lab 09/23/20 0933 09/24/20 0037 09/25/20 0216  ALBUMIN 2.3* 2.4* 2.2*   No results for input(s): LIPASE, AMYLASE in the last 168 hours.  No results for input(s): AMMONIA in the last 168 hours. CBC: Recent Labs  Lab 09/21/20 0144 09/22/20 0115 09/23/20 0933 09/24/20 0037 09/25/20 0216  WBC 3.0* 3.1* 3.2* 3.1* 3.2*  NEUTROABS 1.4* 1.4* 1.8 1.7 1.7  HGB 9.8* 9.1* 9.6* 9.3* 9.0*  HCT 29.2* 26.8* 29.6* 29.0* 27.5*  MCV 91.0 92.4 95.2 96.3 95.8  PLT 213 194 193 195 175   Cardiac Enzymes: No results for input(s): CKTOTAL, CKMB, CKMBINDEX, TROPONINI  in the last 168 hours. CBG: Recent Labs  Lab 09/19/20 0838 09/19/20 1246  GLUCAP 100* 126*    Iron Studies: No results for input(s): IRON, TIBC, TRANSFERRIN, FERRITIN in the last 72 hours. Studies/Results: US Renal Transplant w/Doppler  Result Date: 09/24/2020 CLINICAL DATA:  Acute kidney injury EXAM: ULTRASOUND OF RENAL TRANSPLANT WITH RENAL DOPPLER ULTRASOUND TECHNIQUE: Ultrasound examination of the renal transplant was performed with gray-scale, color and duplex doppler evaluation. COMPARISON:   CT 09/18/2020 FINDINGS: Transplant kidney location: Right pelvis Transplant Kidney: Renal measurements: 12.9 x 7.7 x 7.2 cm = volume: . Prominent fat in the renal sinus with thinning of the parenchyma. No significant hydronephrosis. Mildly increased echotexture throughout. No solid mass lesion. Small cyst measuring 1.9 cm maximal diameter. Echogenic focus in the midpole measuring 1 cm maximal diameter consistent with a stone, as previously seen on CT. Color flow in the main renal artery:  Present Color flow in the main renal vein:  Present Duplex Doppler Evaluation: Main Renal Artery Velocity: 36.2 cm/sec Main Renal Artery Resistive Index: 0.68 Venous waveform in main renal vein:  Present Intrarenal resistive index in upper pole:  0.65 (normal 0.6-0.8; equivocal 0.8-0.9; abnormal >= 0.9) Intrarenal resistive index in lower pole: 0.71 (normal 0.6-0.8; equivocal 0.8-0.9; abnormal >= 0.9) Bladder: Normal for degree of bladder distention. Other findings:  None. IMPRESSION: Right lower quadrant transplant kidney. Nonobstructing stone demonstrated in the midportion of the transplant kidney. Normal Doppler waveforms with normal resistive indices. Electronically Signed   By: Burman Nieves M.D.   On: 09/24/2020 19:54    Medications: Infusions:  sodium chloride Stopped (09/22/20 0437)   sodium chloride 100 mL/hr at 09/23/20 2357    Scheduled Medications:  (feeding supplement) PROSource Plus  30 mL Oral BID BM   famotidine  20 mg Oral Daily   feeding supplement  1 Container Oral TID BM   ferrous sulfate  325 mg Oral Q breakfast   heparin  5,000 Units Subcutaneous Q8H   metoprolol tartrate  50 mg Oral BID   multivitamin with minerals  1 tablet Oral Daily   mycophenolate  360 mg Oral BID   NIFEdipine  30 mg Oral BID   predniSONE  5 mg Oral Q breakfast   psyllium  1 packet Oral Daily   saccharomyces boulardii  250 mg Oral BID   simvastatin  20 mg Oral QHS   sodium chloride flush  3 mL Intravenous  Q12H   tacrolimus  3 mg Oral q AM   tacrolimus  4 mg Oral QHS    have reviewed scheduled and prn medications.  Physical Exam: General:NAD Heart:RRR, s1s2 Lungs: cta bl Abdomen:soft, Non-tender, non-distended, no allograft tenderness Extremities:No LE edema Neurology: Alert awake, speech clear and coherent, moves all ext spontaneously, no tremors  Logan Vegh 09/25/2020,11:23 AM  LOS: 7 days

## 2020-09-25 NOTE — Discharge Instructions (Signed)
Follow with Primary MD  in 7 days   Get CBC, CMP, 2 view Chest X ray -  checked next visit within 1 week by Primary MD   Activity: As tolerated with Full fall precautions use walker/cane & assistance as needed  Disposition Home    Diet: Heart Healthy    Special Instructions: If you have smoked or chewed Tobacco  in the last 2 yrs please stop smoking, stop any regular Alcohol  and or any Recreational drug use.  On your next visit with your primary care physician please Get Medicines reviewed and adjusted.  Please request your Prim.MD to go over all Hospital Tests and Procedure/Radiological results at the follow up, please get all Hospital records sent to your Prim MD by signing hospital release before you go home.  If you experience worsening of your admission symptoms, develop shortness of breath, life threatening emergency, suicidal or homicidal thoughts you must seek medical attention immediately by calling 911 or calling your MD immediately  if symptoms less severe.  You Must read complete instructions/literature along with all the possible adverse reactions/side effects for all the Medicines you take and that have been prescribed to you. Take any new Medicines after you have completely understood and accpet all the possible adverse reactions/side effects.      

## 2020-10-22 ENCOUNTER — Inpatient Hospital Stay (INDEPENDENT_AMBULATORY_CARE_PROVIDER_SITE_OTHER): Payer: Self-pay | Admitting: Primary Care

## 2020-10-28 ENCOUNTER — Ambulatory Visit (HOSPITAL_COMMUNITY): Payer: Self-pay

## 2020-12-01 ENCOUNTER — Inpatient Hospital Stay (INDEPENDENT_AMBULATORY_CARE_PROVIDER_SITE_OTHER): Payer: Self-pay | Admitting: Primary Care

## 2021-08-22 IMAGING — MR MR ABDOMEN WO/W CM MRCP
13 of 18 series · 33 of 48 positions shown · IV contrast (gadavist)
Comparison: 06/11/2020

CLINICAL DATA: Acute pancreatitis.  Status post cholecystectomy.

EXAM:
MRI ABDOMEN WITHOUT AND WITH CONTRAST (INCLUDING MRCP)
TECHNIQUE: Multiplanar multisequence MR imaging of the abdomen was performed
both before and after the administration of intravenous contrast.
Heavily T2-weighted images of the biliary and pancreatic ducts were
obtained, and three-dimensional MRCP images were rendered by post
processing.
CONTRAST:  10mL GADAVIST GADOBUTROL 1 MMOL/ML IV SOLN

[Series 3: T2 · coronal · 5.0mm · 1.56mm/px · 2 of 35 slices shown (1 of 3)]
[im 1/35]
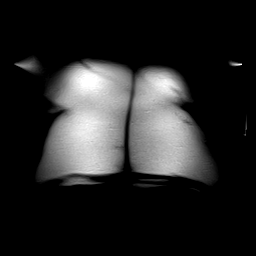
[im 35/35]
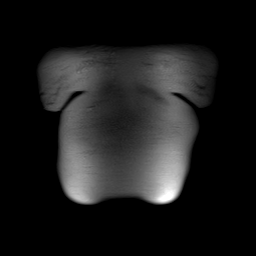

[Series 4: T2 · axial · 6.0mm · 1.56mm/px · z∈[-148,+94]mm · 2 of 36 slices shown (2 of 3)]
[im 1/36]
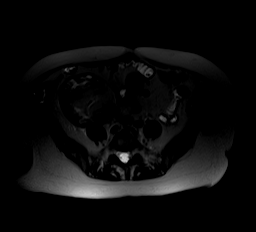
[im 36/36]
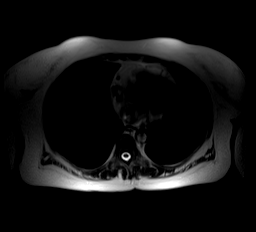

[Series 7: T2 · axial · 6.0mm · 0.78mm/px · 1 of 36 slices shown (3 of 3)]
[im 1/36]
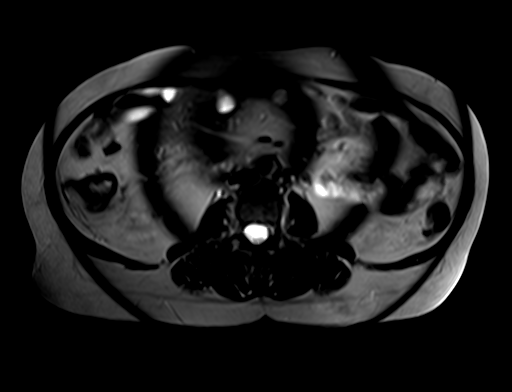

[Series 10: ep2d_[id number]_800_p2 · axial · 6.0mm · 1.93mm/px · z∈[-103,+166]mm · 5 of 120 slices shown]
[im 1/120]
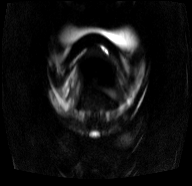
[im 30/120]
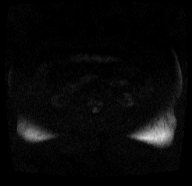
[im 60/120]
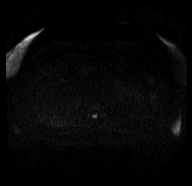
[im 90/120]
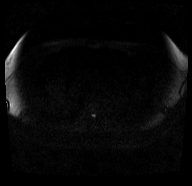
[im 120/120]
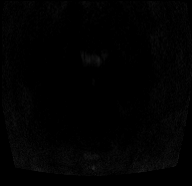

[Series 11: ep2d_[id number]_800_p2_adc · axial · 6.0mm · 1.93mm/px · z∈[-103,+166]mm · 2 of 40 slices shown]
[im 1/40]
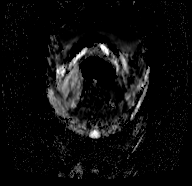
[im 40/40]
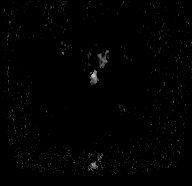

[Series 12: axial tru fisp · axial · 5.0mm · 1.45mm/px · z∈[-153,+99]mm · 2 of 43 slices shown]
[im 1/43]
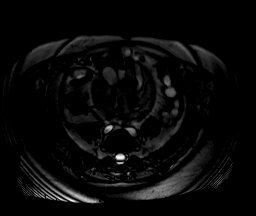
[im 43/43]
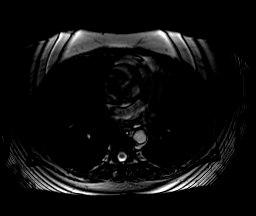

[Series 13: axial in out · axial · 6.0mm · 0.72mm/px · z∈[-144,+90]mm · 3 of 70 slices shown]
[im 1/70]
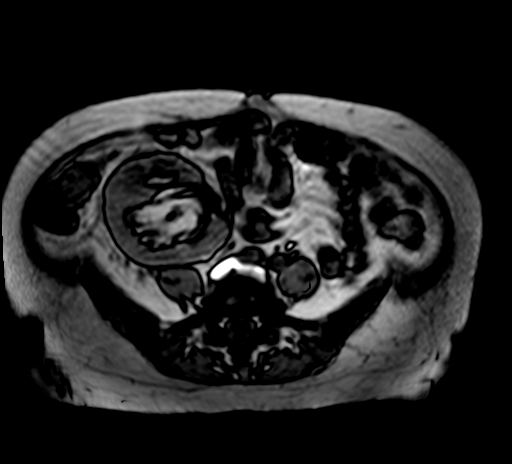
[im 35/70]
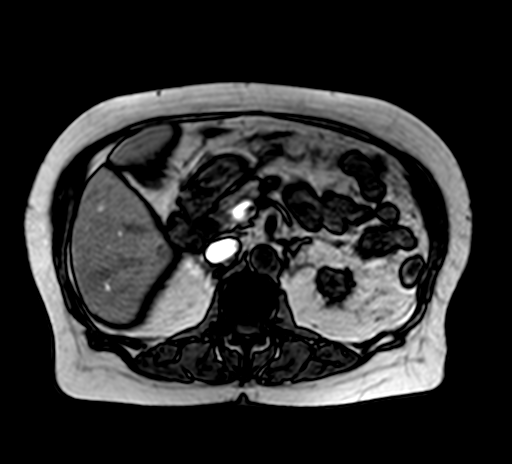
[im 70/70]
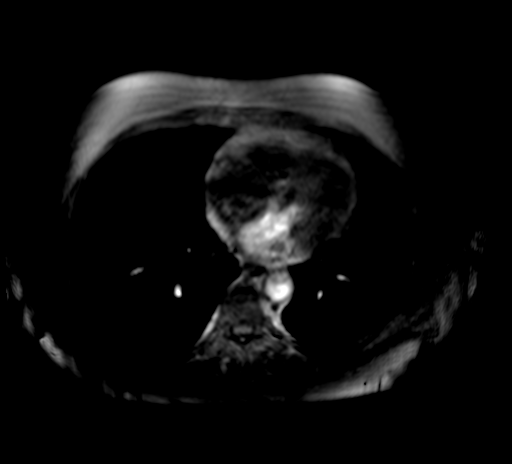

[Series 14: T1 dynamic · axial · non-contrast · 2.5mm · 0.70mm/px · z∈[-111,+86]mm · 3 of 80 slices shown]
[im 1/80]
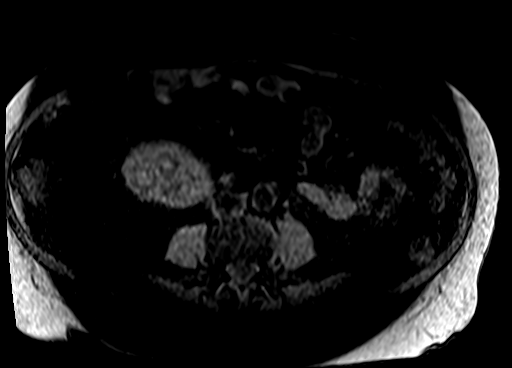
[im 40/80]
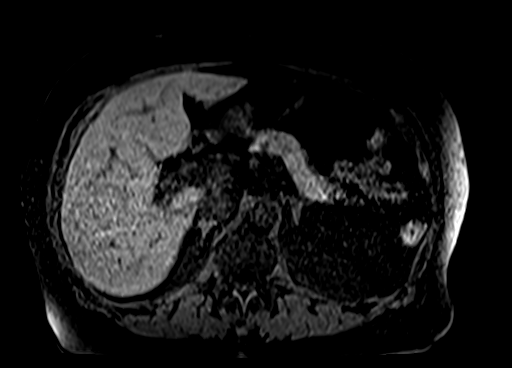
[im 80/80]
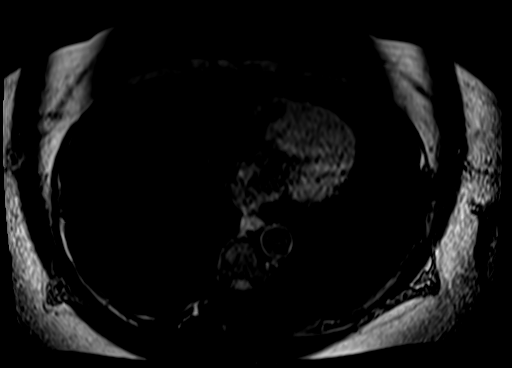

[Series 15: post 25 sec · axial · 2.5mm · 0.70mm/px · z∈[-111,+86]mm · 3 of 80 slices shown]
[im 1/80]
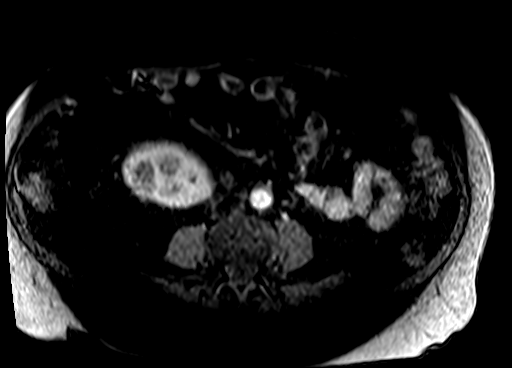
[im 40/80]
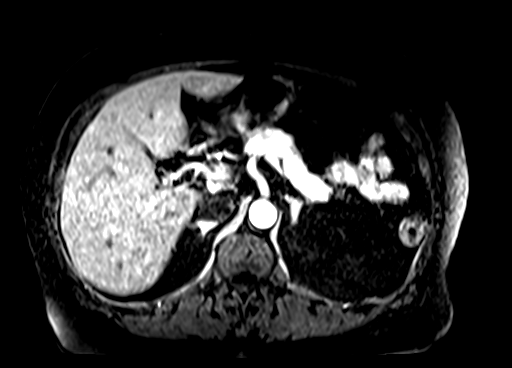
[im 80/80]
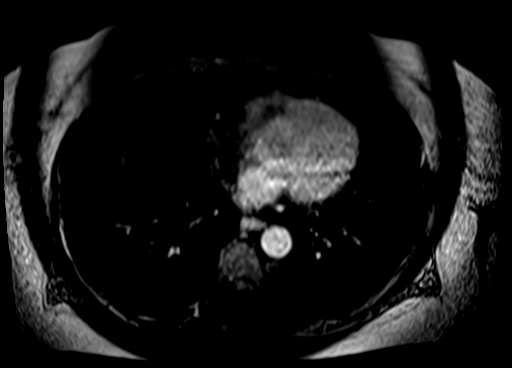

[Series 16: post 25 sec_sub · axial · 2.5mm · 0.70mm/px · z∈[-111,+86]mm · 3 of 80 slices shown]
[im 1/80]
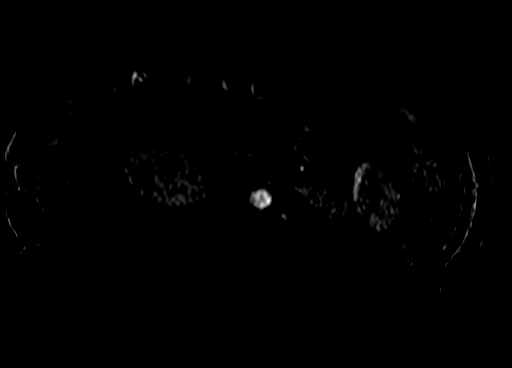
[im 40/80]
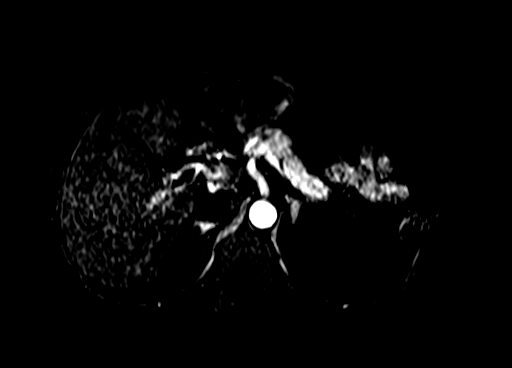
[im 80/80]
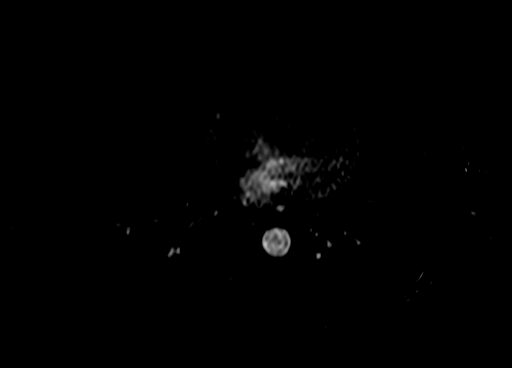

[Series 17: post 45 sec · axial · 2.5mm · 0.70mm/px · z∈[-111,+86]mm · 3 of 80 slices shown]
[im 1/80]
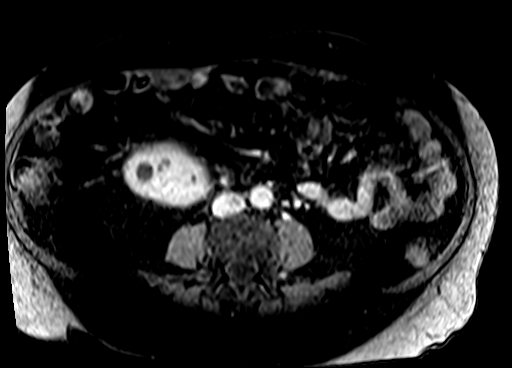
[im 40/80]
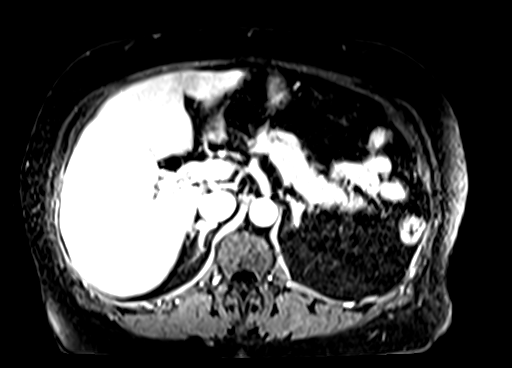
[im 80/80]
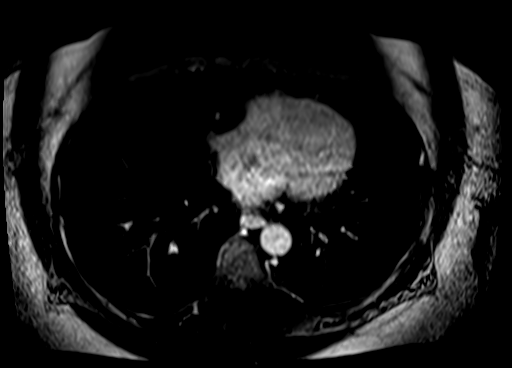

[Series 18: post 45 sec_sub · axial · 2.5mm · 0.70mm/px · z∈[-111,+86]mm · 3 of 80 slices shown]
[im 1/80]
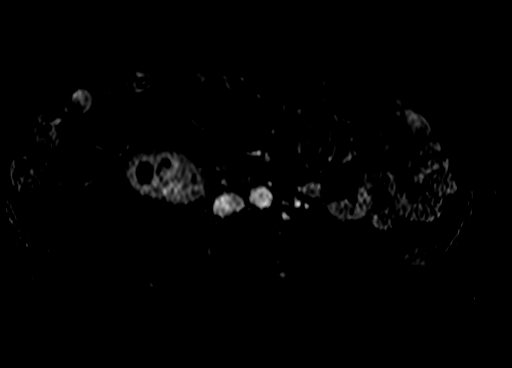
[im 40/80]
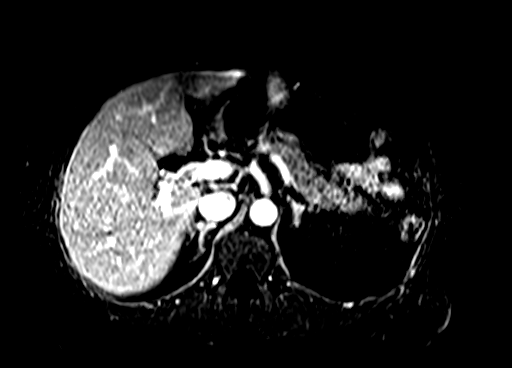
[im 80/80]
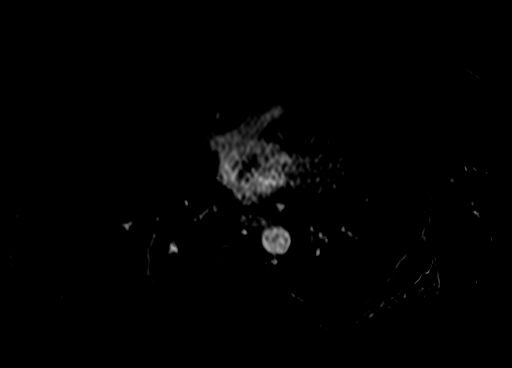

[Series 19: post 90 sec · axial · 2.5mm · 0.70mm/px · 1 of 80 slices shown]
[im 1/80]
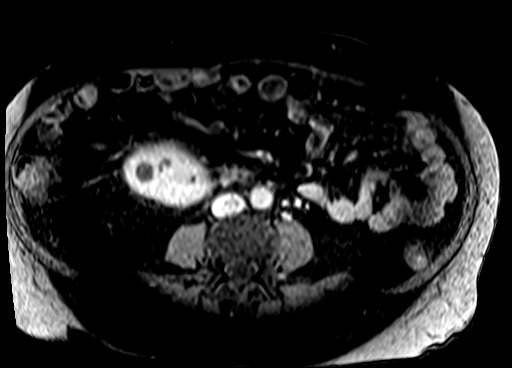

[33 of 48 positions shown; findings below may reference images not displayed]

FINDINGS: Lower chest: No acute findings.

Hepatobiliary: There are no suspicious enhancing liver
abnormalities. Small cysts noted within right hepatic lobe. Previous
cholecystectomy. The fusiform dilatation of the common bile duct
measures up to 1.2 cm, image [DATE]. No signs of choledocholithiasis.

Pancreas: Pancreas divisum anatomy is suspected. Mild increase fluid
signal intensity within the head of pancreas may reflect
uncomplicated focal pancreatitis.

Spleen:  Within normal limits in size and appearance.

Adrenals/Urinary Tract: Normal adrenal glands. Bilateral end stage
kidneys with multiple cysts are noted. Renal graft is partially
visualized within the right lower quadrant of the abdomen. Several
cysts associated with the renal graft are noted

Stomach/Bowel: Visualized portions within the abdomen are
unremarkable.

Vascular/Lymphatic: No pathologically enlarged lymph nodes
identified. No abdominal aortic aneurysm demonstrated.

Other:  None.

Musculoskeletal: No suspicious bone lesions identified.
IMPRESSION: 1. There is mild edema involving the head of pancreas compatible
with uncomplicated pancreatitis. No pseudocyst or pancreatic
necrosis.
2. Pancreas divisum anatomy is suspected, which may increased
patient's risk for idiopathic pancreatitis.
3. Previous cholecystectomy. Mild fusiform dilatation of the common
bile duct measuring up to 1.2 cm. No signs of choledocholithiasis.
